# Patient Record
Sex: Female | Born: 1995 | Race: White | Hispanic: No | Marital: Married | Smoking: Never smoker
Health system: Southern US, Community
[De-identification: ages and names within clinical notes are randomized; demographics above are authoritative.]

## PROBLEM LIST (undated history)

## (undated) DIAGNOSIS — R51 Headache: Secondary | ICD-10-CM

## (undated) DIAGNOSIS — R519 Headache, unspecified: Secondary | ICD-10-CM

## (undated) HISTORY — PX: NO PAST SURGERIES: SHX2092

---
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
## 2014-05-03 ENCOUNTER — Emergency Department (HOSPITAL_COMMUNITY)
Admission: EM | Admit: 2014-05-03 | Discharge: 2014-05-03 | Disposition: A | Discharge status: Home / Self Care | Payer: No Typology Code available for payment source | Attending: Emergency Medicine | Admitting: Emergency Medicine

## 2014-05-03 ENCOUNTER — Emergency Department (HOSPITAL_COMMUNITY): Payer: No Typology Code available for payment source

## 2014-05-03 ENCOUNTER — Encounter (HOSPITAL_COMMUNITY): Payer: Self-pay | Admitting: Emergency Medicine

## 2014-05-03 DIAGNOSIS — Z3202 Encounter for pregnancy test, result negative: Secondary | ICD-10-CM | POA: Insufficient documentation

## 2014-05-03 DIAGNOSIS — Y92410 Unspecified street and highway as the place of occurrence of the external cause: Secondary | ICD-10-CM | POA: Insufficient documentation

## 2014-05-03 DIAGNOSIS — S0990XA Unspecified injury of head, initial encounter: Secondary | ICD-10-CM | POA: Insufficient documentation

## 2014-05-03 DIAGNOSIS — IMO0002 Reserved for concepts with insufficient information to code with codable children: Secondary | ICD-10-CM | POA: Insufficient documentation

## 2014-05-03 DIAGNOSIS — Y9389 Activity, other specified: Secondary | ICD-10-CM | POA: Insufficient documentation

## 2014-05-03 HISTORY — DX: Headache, unspecified: R51.9

## 2014-05-03 HISTORY — DX: Headache: R51

## 2014-05-03 LAB — PREGNANCY, URINE: Preg Test, Ur: NEGATIVE

## 2014-05-03 MED ORDER — TRAMADOL HCL 50 MG PO TABS
50.0000 mg | ORAL_TABLET | Freq: Once | ORAL | Status: AC
  Administered 2014-05-03: 50 mg via ORAL
  Filled 2014-05-03: qty 1

## 2014-05-03 MED ORDER — METHOCARBAMOL 500 MG PO TABS
500.0000 mg | ORAL_TABLET | Freq: Once | ORAL | Status: AC
  Administered 2014-05-03: 500 mg via ORAL
  Filled 2014-05-03: qty 1

## 2014-05-03 MED ORDER — HYDROCODONE-ACETAMINOPHEN 5-325 MG PO TABS: 1.0000 | ORAL_TABLET | Freq: Four times a day (QID) | ORAL | Status: DC | PRN

## 2014-05-03 NOTE — ED Notes (Signed)
PA at bedside.

## 2014-05-03 NOTE — ED Notes (Signed)
Pt verbalizes that she has ALL belongings she arrived with
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
## 2014-05-03 NOTE — ED Notes (Signed)
Pt was called to Fast track room 9. No answer will attempt again shortly

## 2014-05-03 NOTE — Discharge Instructions (Signed)
Please follow the directions provided.  You may take Ibuprofen 400 mg by mouth every 6-8 hours for pain.    SEEK IMMEDIATE MEDICAL CARE IF:  You have numbness, tingling, or weakness in the arms or legs.  You develop severe headaches not relieved with medicine.  You have severe neck pain, especially tenderness in the middle of the back of your neck.  You have changes in bowel or bladder control.  There is increasing pain in any area of the body.  You have shortness of breath, light-headedness, dizziness, or fainting.  You have chest pain.  You feel sick to your stomach (nauseous), throw up (vomit), or sweat.  You have increasing abdominal discomfort.  There is blood in your urine, stool, or vomit.  You have pain in your shoulder (shoulder strap areas).  You feel your symptoms are getting worse.

## 2014-05-03 NOTE — ED Notes (Signed)
Pt ambulated to XRAY with steady gait 

## 2014-05-03 NOTE — ED Notes (Signed)
She reports headache on the top of her head. Denies nausea, vomiting or difficulty thinking.

## 2014-05-06 NOTE — ED Provider Notes (Signed)
Medical screening examination/treatment/procedure(s) were performed by non-physician practitioner and as supervising physician I was immediately available for consultation/collaboration.   EKG Interpretation None       Ari Bernabei, MD 05/06/14 1501 

## 2014-08-13 NOTE — L&D Delivery Note (Signed)
Delivery Note Pt pushed well and at 4:06 AM a healthy female was delivered via Vaginal, Spontaneous Delivery (Presentation: Left Occiput Anterior).  APGAR: 9, 9; weight  pending.   Placenta status: Intact, Spontaneous.  Cord: 3 vessels with the following complications: None.   Anesthesia: Epidural  Episiotomy: None Lacerations:  Bilateral labial abrasions Suture Repair: 3.0 vicryl rapide Est. Blood Loss (mL):    Mom to postpartum.  Baby to Couplet care / Skin to Skin. D/w pt circumcision and they plan to proceed in office Keyonda Bickle W 04/08/2015, 4:35 AM

## 2014-09-21 LAB — OB RESULTS CONSOLE RPR: RPR: NONREACTIVE

## 2014-09-21 LAB — OB RESULTS CONSOLE GC/CHLAMYDIA
Chlamydia: NEGATIVE
Gonorrhea: NEGATIVE

## 2014-09-21 LAB — OB RESULTS CONSOLE RUBELLA ANTIBODY, IGM: Rubella: IMMUNE

## 2014-09-21 LAB — OB RESULTS CONSOLE HIV ANTIBODY (ROUTINE TESTING): HIV: NONREACTIVE

## 2014-09-21 LAB — OB RESULTS CONSOLE HEPATITIS B SURFACE ANTIGEN: Hepatitis B Surface Ag: NEGATIVE

## 2015-03-07 LAB — OB RESULTS CONSOLE GBS: STREP GROUP B AG: NEGATIVE

## 2015-04-07 ENCOUNTER — Encounter (HOSPITAL_COMMUNITY): Payer: Self-pay | Admitting: *Deleted

## 2015-04-07 ENCOUNTER — Inpatient Hospital Stay (HOSPITAL_COMMUNITY)
Admission: AD | Admit: 2015-04-07 | Discharge: 2015-04-07 | Disposition: A | Discharge status: Home / Self Care | Payer: Medicaid Other | Source: Ambulatory Visit | Attending: Obstetrics and Gynecology | Admitting: Obstetrics and Gynecology

## 2015-04-07 ENCOUNTER — Inpatient Hospital Stay (HOSPITAL_COMMUNITY): Payer: Medicaid Other | Admitting: Anesthesiology

## 2015-04-07 ENCOUNTER — Encounter (HOSPITAL_COMMUNITY): Payer: Self-pay

## 2015-04-07 ENCOUNTER — Inpatient Hospital Stay (HOSPITAL_COMMUNITY)
Admission: AD | Admit: 2015-04-07 | Discharge: 2015-04-10 | DRG: 775 | Disposition: A | Discharge status: Home / Self Care | Payer: Medicaid Other | Source: Ambulatory Visit | Attending: Obstetrics and Gynecology | Admitting: Obstetrics and Gynecology

## 2015-04-07 DIAGNOSIS — Z3A39 39 weeks gestation of pregnancy: Secondary | ICD-10-CM | POA: Diagnosis present

## 2015-04-07 DIAGNOSIS — IMO0001 Reserved for inherently not codable concepts without codable children: Secondary | ICD-10-CM

## 2015-04-07 LAB — CBC
HCT: 37.4 % (ref 36.0–46.0)
Hemoglobin: 12.3 g/dL (ref 12.0–15.0)
MCH: 28.2 pg (ref 26.0–34.0)
MCHC: 32.9 g/dL (ref 30.0–36.0)
MCV: 85.8 fL (ref 78.0–100.0)
PLATELETS: 274 10*3/uL (ref 150–400)
RBC: 4.36 MIL/uL (ref 3.87–5.11)
RDW: 13.9 % (ref 11.5–15.5)
WBC: 15.9 10*3/uL — AB (ref 4.0–10.5)

## 2015-04-07 LAB — TYPE AND SCREEN
ABO/RH(D): A POS
ANTIBODY SCREEN: NEGATIVE

## 2015-04-07 LAB — ABO/RH: ABO/RH(D): A POS

## 2015-04-07 MED ORDER — BUTORPHANOL TARTRATE 1 MG/ML IJ SOLN
2.0000 mg | INTRAMUSCULAR | Status: DC | PRN
  Administered 2015-04-07 (×2): 2 mg via INTRAVENOUS
  Filled 2015-04-07 (×2): qty 2

## 2015-04-07 MED ORDER — DIPHENHYDRAMINE HCL 50 MG/ML IJ SOLN: 12.5000 mg | INTRAMUSCULAR | Status: DC | PRN

## 2015-04-07 MED ORDER — OXYCODONE-ACETAMINOPHEN 5-325 MG PO TABS: 2.0000 | ORAL_TABLET | ORAL | Status: DC | PRN

## 2015-04-07 MED ORDER — LIDOCAINE HCL (PF) 1 % IJ SOLN
30.0000 mL | INTRAMUSCULAR | Status: DC | PRN
  Filled 2015-04-07 (×2): qty 30

## 2015-04-07 MED ORDER — ONDANSETRON HCL 4 MG/2ML IJ SOLN: 4.0000 mg | Freq: Four times a day (QID) | INTRAMUSCULAR | Status: DC | PRN

## 2015-04-07 MED ORDER — EPHEDRINE 5 MG/ML INJ
10.0000 mg | INTRAVENOUS | Status: DC | PRN
  Filled 2015-04-07: qty 2

## 2015-04-07 MED ORDER — PHENYLEPHRINE 40 MCG/ML (10ML) SYRINGE FOR IV PUSH (FOR BLOOD PRESSURE SUPPORT)
80.0000 ug | PREFILLED_SYRINGE | INTRAVENOUS | Status: DC | PRN
  Filled 2015-04-07: qty 2

## 2015-04-07 MED ORDER — OXYCODONE-ACETAMINOPHEN 5-325 MG PO TABS: 1.0000 | ORAL_TABLET | ORAL | Status: DC | PRN

## 2015-04-07 MED ORDER — FENTANYL 2.5 MCG/ML BUPIVACAINE 1/10 % EPIDURAL INFUSION (WH - ANES)
14.0000 mL/h | INTRAMUSCULAR | Status: DC | PRN
  Administered 2015-04-07: 14 mL/h via EPIDURAL

## 2015-04-07 MED ORDER — ACETAMINOPHEN 325 MG PO TABS: 650.0000 mg | ORAL_TABLET | ORAL | Status: DC | PRN

## 2015-04-07 MED ORDER — LACTATED RINGERS IV SOLN
INTRAVENOUS | Status: DC
  Administered 2015-04-07 – 2015-04-08 (×2): via INTRAVENOUS

## 2015-04-07 MED ORDER — CITRIC ACID-SODIUM CITRATE 334-500 MG/5ML PO SOLN: 30.0000 mL | ORAL | Status: DC | PRN

## 2015-04-07 MED ORDER — OXYTOCIN 40 UNITS IN LACTATED RINGERS INFUSION - SIMPLE MED
62.5000 mL/h | INTRAVENOUS | Status: DC
  Filled 2015-04-07: qty 1000

## 2015-04-07 MED ORDER — FENTANYL 2.5 MCG/ML BUPIVACAINE 1/10 % EPIDURAL INFUSION (WH - ANES): 14.0000 mL/h | INTRAMUSCULAR | Status: DC | PRN

## 2015-04-07 MED ORDER — PHENYLEPHRINE 40 MCG/ML (10ML) SYRINGE FOR IV PUSH (FOR BLOOD PRESSURE SUPPORT)
PREFILLED_SYRINGE | INTRAVENOUS | Status: AC
  Filled 2015-04-07: qty 20

## 2015-04-07 MED ORDER — OXYTOCIN 40 UNITS IN LACTATED RINGERS INFUSION - SIMPLE MED
1.0000 m[IU]/min | INTRAVENOUS | Status: DC
  Administered 2015-04-07: 2 m[IU]/min via INTRAVENOUS

## 2015-04-07 MED ORDER — LIDOCAINE HCL (PF) 1 % IJ SOLN
INTRAMUSCULAR | Status: DC | PRN
  Administered 2015-04-07: 6 mL via EPIDURAL
  Administered 2015-04-07: 4 mL

## 2015-04-07 MED ORDER — FENTANYL 2.5 MCG/ML BUPIVACAINE 1/10 % EPIDURAL INFUSION (WH - ANES)
INTRAMUSCULAR | Status: AC
  Administered 2015-04-07: 14 mL/h via EPIDURAL
  Filled 2015-04-07: qty 125

## 2015-04-07 MED ORDER — OXYTOCIN BOLUS FROM INFUSION
500.0000 mL | INTRAVENOUS | Status: DC
  Administered 2015-04-08: 500 mL via INTRAVENOUS

## 2015-04-07 MED ORDER — TERBUTALINE SULFATE 1 MG/ML IJ SOLN: 0.2500 mg | Freq: Once | INTRAMUSCULAR | Status: DC | PRN

## 2015-04-07 MED ORDER — FLEET ENEMA 7-19 GM/118ML RE ENEM: 1.0000 | ENEMA | RECTAL | Status: DC | PRN

## 2015-04-07 MED ORDER — LACTATED RINGERS IV SOLN: 500.0000 mL | INTRAVENOUS | Status: DC | PRN

## 2015-04-07 NOTE — MAU Note (Signed)
Patient given a clear liquid diet.

## 2015-04-07 NOTE — MAU Note (Signed)
Dr. Senaida Ores called to have update on patient status. Dr. Elvera Lennox on L&D and will see if patient can be moved to L&D now.

## 2015-04-07 NOTE — H&P (Addendum)
Megan Gray is a 19 y.o. female G1P0 at 39+weeks (EDD 04/11/15 by LMP c/w 11 week [place])  presenting for the second time in 12 hours with c/o painful contractions.  Her cervix has made slow progress but she has been too uncomfortable to stay home. 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    Maternal Medical History:  Reason for admission: Contractions.   Contractions: Onset was 6-12 hours ago.   Frequency: regular.   Perceived severity is moderate.    Fetal activity: Perceived fetal activity is normal.    Prenatal Complications - Diabetes: none.    OB History    Gravida Para Term Preterm AB TAB SAB Ectopic Multiple Living   1 0        0     Past Medical History  Diagnosis Date  . Headache    Past Surgical History  Procedure Laterality Date  . No past surgeries     Family History: family history is not on file. Social History:  reports that she has never smoked. She has never used smokeless tobacco. She reports that she does not drink alcohol or use illicit drugs.   Prenatal Transfer Tool  Maternal Diabetes: No Genetic Screening: Declined Maternal Ultrasounds/Referrals: Normal Fetal Ultrasounds or other Referrals:  None Maternal Substance Abuse:  No Significant Maternal Medications:  Meds include: Other:   valtrex Significant Maternal Lab Results:  None Other Comments:  None  ROS  Dilation: 2 Effacement (%): 80 Station: -2 Exam by:: Dellie Burns, RN BSN Blood pressure 132/87, pulse 85, temperature 98.4 F (36.9 C), resp. rate 16, height  (1.753 m), weight 83.915 kg (185 lb), last menstrual period 04/13/2014. Maternal Exam:  Uterine Assessment: Contraction strength is moderate.  Contraction frequency is regular.   Abdomen: Fetal presentation: vertex  Introitus: Normal vulva. Vulva is negative for lesion.  Normal vagina.    Physical Exam   Constitutional: She appears well-developed and well-nourished.  Cardiovascular: Normal rate.   Respiratory: Effort normal.  GI: Soft.  Genitourinary: Vagina normal. Vulva exhibits no lesion.  Neurological: She is alert.  Psychiatric: She has a normal mood and affect.    Prenatal labs: ABO, Rh:  A positive  Antibody:  negative Rubella:  immune RPR:   Neg HBsAg:   neg HIV:   NR GBS:   negative One hour GTT 99 Declined genetics  Assessment/Plan: Pt will be admitted in early labor for pain management and augment as needed.     Oliver Pila 04/07/2015, 12:40 PM   Pt seen in MAU as L&D very busy and cannot take pt.  Pt resting comfortably in bed, states contractions are worse sometimes than others.  Has not received any pain medication as of yet.  Has stadol ordered, but I advised pt she should hold off until more uncomfortable. Cervix reexamined and 80/2/-2 with no significant change.  We Discussed early labor and how this can be protracted.  I offered her d/c to home so she could eat and rest since may be awhile before L&D available and she is not changing her  cervix rapidly.  Pt states she feels she needs to stay as pain is already almost unbearable.  Advised could be several more hours.

## 2015-04-07 NOTE — Progress Notes (Signed)
Patient ID: Megan Gray, female   DOB: 03/02/1996, 19 y.o.   MRN: [id number] Pt just now got to a bed on L&D Has received stadol x 2 in MAU afeb vss FHR reassuring, no decels Cervix 90/3-4/-1 AROM clear  Will begin pitocin to augment

## 2015-04-07 NOTE — Progress Notes (Signed)
Dr. Jackelyn Knife notified of patient status. Per MD give patient option of being rechecked in 1 hour following ambulation and if unchanged discharge home or discharge home now.

## 2015-04-07 NOTE — MAU Provider Note (Signed)
Pt still awaiting bed in MAU Tearful and flushed stating in pain and needing epidural FHR reassuring intermittently afeb vss Cervix 2+/90/-2  Pt still in early labor and L&D full Explained to pt and family that pts who have more advanced dilation must  take precedence in moving to L&D and offered stadol Pt agreeable to stadol and this is given Will put baby back on continuous monitoring

## 2015-04-07 NOTE — Anesthesia Procedure Notes (Signed)
Epidural Patient location during procedure: OB  Preanesthetic Checklist Completed: patient identified, site marked, surgical consent, pre-op evaluation, timeout performed, IV checked, risks and benefits discussed and monitors and equipment checked  Epidural Patient position: sitting Prep: site prepped and draped and DuraPrep Patient monitoring: continuous pulse ox and blood pressure Approach: midline Location: L3-L4 Injection technique: LOR air  Needle:  Needle type: Tuohy  Needle gauge: 17 G Needle length: 9 cm and 9 Needle insertion depth: 4 cm Catheter type: closed end flexible Catheter size: 19 Gauge Catheter at skin depth: 10 cm Test dose: negative  Assessment Events: blood not aspirated, injection not painful, no injection resistance, negative IV test and no paresthesia  Additional Notes Dosing of Epidural:  1st dose, through catheter .............................................  Xylocaine 40 mg  2nd dose, through catheter, after waiting 3 minutes.........Xylocaine 60 mg    As each dose occurred, patient was free of IV sx; and patient exhibited no evidence of SA injection.  Patient is more comfortable after epidural dosed. Please see RN's note for documentation of vital signs,and FHR which are stable.  Patient reminded not to try to ambulate with numb legs, and that an RN must be present when she attempts to get up.       

## 2015-04-07 NOTE — Progress Notes (Signed)
Patient desires to ambulate and be rechecked in 1 hour.

## 2015-04-07 NOTE — MAU Note (Signed)
Pt presents complaining of contractions every 4 minutes. Denies bleeding or leaking of fluid. Reports good fetal movement.

## 2015-04-07 NOTE — MAU Note (Signed)
Started cramping around 5:00 pm yesterday came in MAU around 1:30 was sent home at 5:30 am today, 2 cm when left, now 10/10

## 2015-04-07 NOTE — Progress Notes (Signed)
FHT from this am reviewed.  Reactive NST, fairly regular ctx.

## 2015-04-07 NOTE — Discharge Instructions (Signed)
Fetal Movement Counts °Patient Name: __________________________________________________ Patient Due Date: ____________________ °Performing a fetal movement count is highly recommended in high-risk pregnancies, but it is good for every pregnant woman to do. Your health care provider may ask you to start counting fetal movements at 28 weeks of the pregnancy. Fetal movements often increase: °· After eating a full meal. °· After physical activity. °· After eating or drinking something sweet or cold. °· At rest. °Pay attention to when you feel the baby is most active. This will help you notice a pattern of your baby's sleep and wake cycles and what factors contribute to an increase in fetal movement. It is important to perform a fetal movement count at the same time each day when your baby is normally most active.  °HOW TO COUNT FETAL MOVEMENTS °1. Find a quiet and comfortable area to sit or lie down on your left side. Lying on your left side provides the best blood and oxygen circulation to your baby. °2. Write down the day and time on a sheet of paper or in a journal. °3. Start counting kicks, flutters, swishes, rolls, or jabs in a 2-hour period. You should feel at least 10 movements within 2 hours. °4. If you do not feel 10 movements in 2 hours, wait 2-3 hours and count again. Look for a change in the pattern or not enough counts in 2 hours. °SEEK MEDICAL CARE IF: °· You feel less than 10 counts in 2 hours, tried twice. °· There is no movement in over an hour. °· The pattern is changing or taking longer each day to reach 10 counts in 2 hours. °· You feel the baby is not moving as he or she usually does. °Date: ____________ Movements: ____________ Start time: ____________ Finish time: ____________  °Date: ____________ Movements: ____________ Start time: ____________ Finish time: ____________ °Date: ____________ Movements: ____________ Start time: ____________ Finish time: ____________ °Date: ____________ Movements:  ____________ Start time: ____________ Finish time: ____________ °Date: ____________ Movements: ____________ Start time: ____________ Finish time: ____________ °Date: ____________ Movements: ____________ Start time: ____________ Finish time: ____________ °Date: ____________ Movements: ____________ Start time: ____________ Finish time: ____________ °Date: ____________ Movements: ____________ Start time: ____________ Finish time: ____________  °Date: ____________ Movements: ____________ Start time: ____________ Finish time: ____________ °Date: ____________ Movements: ____________ Start time: ____________ Finish time: ____________ °Date: ____________ Movements: ____________ Start time: ____________ Finish time: ____________ °Date: ____________ Movements: ____________ Start time: ____________ Finish time: ____________ °Date: ____________ Movements: ____________ Start time: ____________ Finish time: ____________ °Date: ____________ Movements: ____________ Start time: ____________ Finish time: ____________ °Date: ____________ Movements: ____________ Start time: ____________ Finish time: ____________  °Date: ____________ Movements: ____________ Start time: ____________ Finish time: ____________ °Date: ____________ Movements: ____________ Start time: ____________ Finish time: ____________ °Date: ____________ Movements: ____________ Start time: ____________ Finish time: ____________ °Date: ____________ Movements: ____________ Start time: ____________ Finish time: ____________ °Date: ____________ Movements: ____________ Start time: ____________ Finish time: ____________ °Date: ____________ Movements: ____________ Start time: ____________ Finish time: ____________ °Date: ____________ Movements: ____________ Start time: ____________ Finish time: ____________  °Date: ____________ Movements: ____________ Start time: ____________ Finish time: ____________ °Date: ____________ Movements: ____________ Start time: ____________ Finish  time: ____________ °Date: ____________ Movements: ____________ Start time: ____________ Finish time: ____________ °Date: ____________ Movements: ____________ Start time: ____________ Finish time: ____________ °Date: ____________ Movements: ____________ Start time: ____________ Finish time: ____________ °Date: ____________ Movements: ____________ Start time: ____________ Finish time: ____________ °Date: ____________ Movements: ____________ Start time: ____________ Finish time: ____________  °Date: ____________ Movements: ____________ Start time: ____________ Finish   time: ____________ °Date: ____________ Movements: ____________ Start time: ____________ Finish time: ____________ °Date: ____________ Movements: ____________ Start time: ____________ Finish time: ____________ °Date: ____________ Movements: ____________ Start time: ____________ Finish time: ____________ °Date: ____________ Movements: ____________ Start time: ____________ Finish time: ____________ °Date: ____________ Movements: ____________ Start time: ____________ Finish time: ____________ °Date: ____________ Movements: ____________ Start time: ____________ Finish time: ____________  °Date: ____________ Movements: ____________ Start time: ____________ Finish time: ____________ °Date: ____________ Movements: ____________ Start time: ____________ Finish time: ____________ °Date: ____________ Movements: ____________ Start time: ____________ Finish time: ____________ °Date: ____________ Movements: ____________ Start time: ____________ Finish time: ____________ °Date: ____________ Movements: ____________ Start time: ____________ Finish time: ____________ °Date: ____________ Movements: ____________ Start time: ____________ Finish time: ____________ °Date: ____________ Movements: ____________ Start time: ____________ Finish time: ____________  °Date: ____________ Movements: ____________ Start time: ____________ Finish time: ____________ °Date: ____________  Movements: ____________ Start time: ____________ Finish time: ____________ °Date: ____________ Movements: ____________ Start time: ____________ Finish time: ____________ °Date: ____________ Movements: ____________ Start time: ____________ Finish time: ____________ °Date: ____________ Movements: ____________ Start time: ____________ Finish time: ____________ °Date: ____________ Movements: ____________ Start time: ____________ Finish time: ____________ °Date: ____________ Movements: ____________ Start time: ____________ Finish time: ____________  °Date: ____________ Movements: ____________ Start time: ____________ Finish time: ____________ °Date: ____________ Movements: ____________ Start time: ____________ Finish time: ____________ °Date: ____________ Movements: ____________ Start time: ____________ Finish time: ____________ °Date: ____________ Movements: ____________ Start time: ____________ Finish time: ____________ °Date: ____________ Movements: ____________ Start time: ____________ Finish time: ____________ °Date: ____________ Movements: ____________ Start time: ____________ Finish time: ____________ °Document Released: 08/29/2006 Document Revised: 12/14/2013 Document Reviewed: 05/26/2012 °[employer]® Patient Information ©2015 [employer]. This information is not intended to replace advice given to you by your health care provider. Make sure you discuss any questions you have with your health care provider. °Braxton Hicks Contractions °Contractions of the uterus can occur throughout pregnancy. Contractions are not always a sign that you are in labor.  °WHAT ARE BRAXTON HICKS CONTRACTIONS?  °Contractions that occur before labor are called Braxton Hicks contractions, or false labor. Toward the end of pregnancy (32-34 weeks), these contractions can develop more often and may become more forceful. This is not true labor because these contractions do not result in opening (dilatation) and thinning of the cervix. They  are sometimes difficult to tell apart from true labor because these contractions can be forceful and people have different pain tolerances. You should not feel embarrassed if you go to the hospital with false labor. Sometimes, the only way to tell if you are in true labor is for your health care provider to look for changes in the cervix. °If there are no prenatal problems or other health problems associated with the pregnancy, it is completely safe to be sent home with false labor and await the onset of true labor. °HOW CAN YOU TELL THE DIFFERENCE BETWEEN TRUE AND FALSE LABOR? °False Labor °· The contractions of false labor are usually shorter and not as hard as those of true labor.   °· The contractions are usually irregular.   °· The contractions are often felt in the front of the lower abdomen and in the groin.   °· The contractions may go away when you walk around or change positions while lying down.   °· The contractions get weaker and are shorter lasting as time goes on.   °· The contractions do not usually become progressively stronger, regular, and closer together as with true labor.   °True Labor °5. Contractions in true labor last 30-70 seconds, become   very regular, usually become more intense, and increase in frequency.   °6. The contractions do not go away with walking.   °7. The discomfort is usually felt in the top of the uterus and spreads to the lower abdomen and low back.   °8. True labor can be determined by your health care provider with an exam. This will show that the cervix is dilating and getting thinner.   °WHAT TO REMEMBER °· Keep up with your usual exercises and follow other instructions given by your health care provider.   °· Take medicines as directed by your health care provider.   °· Keep your regular prenatal appointments.   °· Eat and drink lightly if you think you are going into labor.   °· If Braxton Hicks contractions are making you uncomfortable:   °· Change your position from  lying down or resting to walking, or from walking to resting.   °· Sit and rest in a tub of warm water.   °· Drink 2-3 glasses of water. Dehydration may cause these contractions.   °· Do slow and deep breathing several times an hour.   °WHEN SHOULD I SEEK IMMEDIATE MEDICAL CARE? °Seek immediate medical care if: °· Your contractions become stronger, more regular, and closer together.   °· You have fluid leaking or gushing from your vagina.   °· You have a fever.   °· You pass blood-tinged mucus.   °· You have vaginal bleeding.   °· You have continuous abdominal pain.   °· You have low back pain that you never had before.   °· You feel your baby's head pushing down and causing pelvic pressure.   °· Your baby is not moving as much as it used to.   °Document Released: 07/30/2005 Document Revised: 08/04/2013 Document Reviewed: 05/11/2013 °[employer]® Patient Information ©2015 [employer]. This information is not intended to replace advice given to you by your health care provider. Make sure you discuss any questions you have with your health care provider. ° °

## 2015-04-07 NOTE — Anesthesia Preprocedure Evaluation (Signed)

## 2015-04-08 ENCOUNTER — Encounter (HOSPITAL_COMMUNITY): Payer: Self-pay | Admitting: General Practice

## 2015-04-08 LAB — CBC
HCT: 33.6 % — ABNORMAL LOW (ref 36.0–46.0)
Hemoglobin: 11 g/dL — ABNORMAL LOW (ref 12.0–15.0)
MCH: 28.1 pg (ref 26.0–34.0)
MCHC: 32.7 g/dL (ref 30.0–36.0)
MCV: 85.9 fL (ref 78.0–100.0)
PLATELETS: 248 10*3/uL (ref 150–400)
RBC: 3.91 MIL/uL (ref 3.87–5.11)
RDW: 14.1 % (ref 11.5–15.5)
WBC: 21.6 10*3/uL — AB (ref 4.0–10.5)

## 2015-04-08 LAB — RPR: RPR Ser Ql: NONREACTIVE

## 2015-04-08 MED ORDER — LANOLIN HYDROUS EX OINT: TOPICAL_OINTMENT | CUTANEOUS | Status: DC | PRN

## 2015-04-08 MED ORDER — SENNOSIDES-DOCUSATE SODIUM 8.6-50 MG PO TABS
2.0000 | ORAL_TABLET | ORAL | Status: DC
  Administered 2015-04-08 – 2015-04-09 (×2): 2 via ORAL
  Filled 2015-04-08 (×2): qty 2

## 2015-04-08 MED ORDER — ZOLPIDEM TARTRATE 5 MG PO TABS: 5.0000 mg | ORAL_TABLET | Freq: Every evening | ORAL | Status: DC | PRN

## 2015-04-08 MED ORDER — IBUPROFEN 600 MG PO TABS
600.0000 mg | ORAL_TABLET | Freq: Four times a day (QID) | ORAL | Status: DC
Start: 2015-04-08 — End: 2015-04-10
  Administered 2015-04-08 – 2015-04-10 (×10): 600 mg via ORAL
  Filled 2015-04-08 (×10): qty 1

## 2015-04-08 MED ORDER — WITCH HAZEL-GLYCERIN EX PADS: 1.0000 "application " | MEDICATED_PAD | CUTANEOUS | Status: DC | PRN

## 2015-04-08 MED ORDER — PRENATAL MULTIVITAMIN CH
1.0000 | ORAL_TABLET | Freq: Every day | ORAL | Status: DC
  Administered 2015-04-08 – 2015-04-10 (×3): 1 via ORAL
  Filled 2015-04-08 (×3): qty 1

## 2015-04-08 MED ORDER — OXYCODONE-ACETAMINOPHEN 5-325 MG PO TABS: 2.0000 | ORAL_TABLET | ORAL | Status: DC | PRN

## 2015-04-08 MED ORDER — VALACYCLOVIR HCL 500 MG PO TABS
1000.0000 mg | ORAL_TABLET | Freq: Every day | ORAL | Status: DC
  Administered 2015-04-08 – 2015-04-10 (×3): 1000 mg via ORAL
  Filled 2015-04-08 (×4): qty 2

## 2015-04-08 MED ORDER — DIBUCAINE 1 % RE OINT: 1.0000 "application " | TOPICAL_OINTMENT | RECTAL | Status: DC | PRN

## 2015-04-08 MED ORDER — SIMETHICONE 80 MG PO CHEW
80.0000 mg | CHEWABLE_TABLET | ORAL | Status: DC | PRN
Start: 2015-04-08 — End: 2015-04-10

## 2015-04-08 MED ORDER — TETANUS-DIPHTH-ACELL PERTUSSIS 5-2.5-18.5 LF-MCG/0.5 IM SUSP
0.5000 mL | Freq: Once | INTRAMUSCULAR | Status: DC
Start: 2015-04-09 — End: 2015-04-10

## 2015-04-08 MED ORDER — ONDANSETRON HCL 4 MG PO TABS: 4.0000 mg | ORAL_TABLET | ORAL | Status: DC | PRN

## 2015-04-08 MED ORDER — DIPHENHYDRAMINE HCL 25 MG PO CAPS: 25.0000 mg | ORAL_CAPSULE | Freq: Four times a day (QID) | ORAL | Status: DC | PRN

## 2015-04-08 MED ORDER — ACETAMINOPHEN 325 MG PO TABS: 650.0000 mg | ORAL_TABLET | ORAL | Status: DC | PRN

## 2015-04-08 MED ORDER — ONDANSETRON HCL 4 MG/2ML IJ SOLN: 4.0000 mg | INTRAMUSCULAR | Status: DC | PRN

## 2015-04-08 MED ORDER — BENZOCAINE-MENTHOL 20-0.5 % EX AERO
1.0000 "application " | INHALATION_SPRAY | CUTANEOUS | Status: DC | PRN
  Administered 2015-04-08: 1 via TOPICAL
  Filled 2015-04-08: qty 56

## 2015-04-08 MED ORDER — OXYCODONE-ACETAMINOPHEN 5-325 MG PO TABS: 1.0000 | ORAL_TABLET | ORAL | Status: DC | PRN

## 2015-04-08 NOTE — Progress Notes (Signed)
Patient ID: Megan Gray, female   DOB: 28-Jul-1996, 19 y.o.   MRN: [id number] DOD  Doing fine no c/o

## 2015-04-08 NOTE — Progress Notes (Signed)
Patient ID: Megan Gray, female   DOB: 1995-10-16, 19 y.o.   MRN: [id number] Pt comfortable but feeling some pressure afeb vss FHR category 1 Cervix c/c/rim  +2 Setting up delivery table and then will begin pushing shortly when complete

## 2015-04-08 NOTE — Lactation Note (Signed)
This note was copied from the chart of Megan Countess Biebel. Lactation Consultation Note  Patient Name: Megan Gray [id number] Date: 04/08/2015 Reason for consult: Initial assessment   Maternal Data Formula Feeding for Exclusion: No Has patient been taught Hand Expression?: Yes Does the patient have breastfeeding experience prior to this delivery?: No  Feeding Feeding Type: Bottle Fed - Formula Nipple Type: Slow - flow Length of feed: 10 min  LATCH Score/Interventions Latch: Repeated attempts needed to sustain latch, nipple held in mouth throughout feeding, stimulation needed to elicit sucking reflex. Intervention(s): Adjust position;Assist with latch  Audible Swallowing: None  Type of Nipple: Everted at rest and after stimulation  Comfort (Breast/Nipple): Soft / non-tender     Hold (Positioning): Assistance needed to correctly position infant at breast and maintain latch. Intervention(s): Breastfeeding basics reviewed;Support Pillows;Position options;Skin to skin  LATCH Score: 6  Lactation Tools Discussed/Used Pump Review: Setup, frequency, and cleaning;Milk Storage;Other (comment) (premie setting, hand expression) Initiated by:: clee rn Date initiated:: 04/08/15   Consult Status Consult Status: Follow-up Date: 04/09/15 Follow-up type: In-patient    Megan Gray 04/08/2015, 6:09 PM

## 2015-04-08 NOTE — Lactation Note (Signed)
This note was copied from the chart of Megan Leigh Blas. Lactation Consultation Note  Patient Name: Megan Gray [id number] Date: 04/08/2015 Reason for consult: Initial assessment   With this mom of a term baby, now 63 hours old. Mom has had trouble latching the baby since birth. On exam, he has an anterior tongue frenulum that is sort, causing his tongue to stay behind his bottom gum, and has limited tongue mobility. Mom does not want to use a nipple shiled, but agreed to pumping. When she pumped and did not express any colostrum, I advised her to hand express. Mom is extremely uncomfortable with anyone seeing her breast, including her grandmother. Mom kept her hand covering her breast as I was trying to latch the baby. Mom decided to formula feed, but may pump and provide EBM. I explained that her milk should come in in a day or two, and pumping and bottle feeding EBm would be fine. Mom is going to think about it.  Dad formula fed the baby 10 mls of formula, in about 10 minutes. [name] had trouble coordinating his suck for a few minutes, and then got into a normal suck and swallow pattern. Parents aware to speak to their pediatrician about baby's oral anatomy, and that lactation is available in o/p if they desire.    Maternal Data Formula Feeding for Exclusion: No Has patient been taught Hand Expression?: Yes Does the patient have breastfeeding experience prior to this delivery?: No  Feeding Feeding Type: Breast Fed Length of feed: 30 min  LATCH Score/Interventions Latch: Repeated attempts needed to sustain latch, nipple held in mouth throughout feeding, stimulation needed to elicit sucking reflex. Intervention(s): Adjust position;Assist with latch  Audible Swallowing: None  Type of Nipple: Everted at rest and after stimulation  Comfort (Breast/Nipple): Soft / non-tender     Hold (Positioning): Assistance needed to correctly position infant at breast and maintain  latch. Intervention(s): Breastfeeding basics reviewed;Support Pillows;Position options;Skin to skin  LATCH Score: 6  Lactation Tools Discussed/Used Pump Review: Setup, frequency, and cleaning;Milk Storage;Other (comment) (premie setting, hand expression) Initiated by:: clee rn Date initiated:: 04/08/15   Consult Status Consult Status: Follow-up Date: 04/09/15 Follow-up type: In-patient    Alfred Levins 04/08/2015, 5:58 PM

## 2015-04-08 NOTE — Anesthesia Postprocedure Evaluation (Signed)
  Anesthesia Post-op Note  Patient: Megan Gray  Procedure(s) Performed: * No procedures listed *  Patient Location: [facility]  Anesthesia Type:Epidural  Level of Consciousness: awake  Airway and Oxygen Therapy: Patient Spontanous Breathing  Post-op Pain: mild  Post-op Assessment: Patient's Cardiovascular Status Stable and Respiratory Function Stable              Post-op Vital Signs: stable  Last Vitals:  Filed Vitals:   04/08/15 0728  BP: 91/48  Pulse: 93  Temp: 37.4 C  Resp:     Complications: No apparent anesthesia complications

## 2015-04-09 NOTE — Progress Notes (Signed)
Post Partum Day 1 Subjective: no complaints, up ad lib, voiding, tolerating PO and nl lochia, pain controlled  Objective: Blood pressure 97/60, pulse 100, temperature 98 F (36.7 C), temperature source Oral, resp. rate 16, height  (1.753 m), weight 83.915 kg (185 lb), last menstrual period 04/13/2014, SpO2 100 %, unknown if currently breastfeeding.  Physical Exam:  General: alert and no distress Lochia: appropriate Uterine Fundus: firm   Recent Labs  04/07/15 1640 04/08/15 0855  HGB 12.3 11.0*  HCT 37.4 33.6*    Assessment/Plan: Plan for discharge tomorrow, Breastfeeding and Lactation consult.  Routine PP care.     LOS: 2 days   Bovard-Stuckert, Jeziah Kretschmer 04/09/2015, 7:57 AM

## 2015-04-10 MED ORDER — IBUPROFEN 800 MG PO TABS: 800.0000 mg | ORAL_TABLET | Freq: Three times a day (TID) | ORAL | Status: DC | PRN

## 2015-04-10 MED ORDER — PRENATAL MULTIVITAMIN CH: 1.0000 | ORAL_TABLET | Freq: Every day | ORAL | Status: AC

## 2015-04-10 MED ORDER — OXYCODONE-ACETAMINOPHEN 5-325 MG PO TABS: 1.0000 | ORAL_TABLET | Freq: Four times a day (QID) | ORAL | Status: DC | PRN

## 2015-04-10 NOTE — Discharge Summary (Signed)
Obstetric Discharge Summary Reason for Admission: onset of labor Prenatal Procedures: none Intrapartum Procedures: spontaneous vaginal delivery Postpartum Procedures: none Complications-Operative and Postpartum: B labial abrasion/ laceration HEMOGLOBIN  Date Value Ref Range Status  04/08/2015 11.0* 12.0 - 15.0 g/dL Final   HCT  Date Value Ref Range Status  04/08/2015 33.6* 36.0 - 46.0 % Final    Physical Exam:  General: alert and no distress Lochia: appropriate Uterine Fundus: firm  Discharge Diagnoses: Term Pregnancy-delivered  Discharge Information: Date: 04/10/2015 Activity: pelvic rest Diet: routine Medications: PNV, Ibuprofen and Percocet Condition: stable Instructions: refer to practice specific booklet Discharge to: home Follow-up Information    Follow up with Oliver Pila, MD. Schedule an appointment as soon as possible for a visit in 6 weeks.   Specialty:  Obstetrics and Gynecology   Why:  for postpartum check   Contact information:   [address] [place] [address] [phone]       Newborn Data: Live born female  Birth Weight: 8 lb 2.6 oz (3702 g) APGAR: 9, 9  Home with mother.  Bovard-Stuckert, Anissia Wessells 04/10/2015, 9:39 AM

## 2015-04-10 NOTE — Progress Notes (Signed)
Post Partum Day 2 Subjective: no complaints, up ad lib, voiding, tolerating PO and nl lochia, pain controlled  Objective: Blood pressure 108/71, pulse 82, temperature 98.1 F (36.7 C), temperature source Oral, resp. rate 20, height  (1.753 m), weight 83.915 kg (185 lb), last menstrual period 04/13/2014, SpO2 97 %, unknown if currently breastfeeding.  Physical Exam:  General: alert and no distress Lochia: appropriate Uterine Fundus: firm  Recent Labs  04/07/15 1640 04/08/15 0855  HGB 12.3 11.0*  HCT 37.4 33.6*    Assessment/Plan: Discharge home, Breastfeeding and Lactation consult.  Routine PP care.  D/C with Motrin, percocet and PNV.  F/u 6 weeks   LOS: 3 days   Bovard-Stuckert, Megan Gray 04/10/2015, 8:40 AM

## 2017-01-23 DIAGNOSIS — J029 Acute pharyngitis, unspecified: Secondary | ICD-10-CM | POA: Diagnosis not present

## 2017-01-23 DIAGNOSIS — J301 Allergic rhinitis due to pollen: Secondary | ICD-10-CM | POA: Diagnosis not present

## 2017-01-23 DIAGNOSIS — Z0000 Encounter for general adult medical examination without abnormal findings: Secondary | ICD-10-CM | POA: Diagnosis not present

## 2017-02-07 DIAGNOSIS — R208 Other disturbances of skin sensation: Secondary | ICD-10-CM | POA: Diagnosis not present

## 2017-03-01 DIAGNOSIS — Z0000 Encounter for general adult medical examination without abnormal findings: Secondary | ICD-10-CM | POA: Diagnosis not present

## 2017-03-17 DIAGNOSIS — S134XXA Sprain of ligaments of cervical spine, initial encounter: Secondary | ICD-10-CM | POA: Diagnosis not present

## 2017-03-17 DIAGNOSIS — M542 Cervicalgia: Secondary | ICD-10-CM | POA: Diagnosis not present

## 2017-03-17 DIAGNOSIS — S39012A Strain of muscle, fascia and tendon of lower back, initial encounter: Secondary | ICD-10-CM | POA: Diagnosis not present

## 2017-04-12 DIAGNOSIS — M542 Cervicalgia: Secondary | ICD-10-CM | POA: Diagnosis not present

## 2017-04-12 DIAGNOSIS — M545 Low back pain: Secondary | ICD-10-CM | POA: Diagnosis not present

## 2017-05-10 DIAGNOSIS — M542 Cervicalgia: Secondary | ICD-10-CM | POA: Diagnosis not present

## 2017-05-10 DIAGNOSIS — M545 Low back pain: Secondary | ICD-10-CM | POA: Diagnosis not present

## 2017-10-03 DIAGNOSIS — Z3041 Encounter for surveillance of contraceptive pills: Secondary | ICD-10-CM | POA: Diagnosis not present

## 2017-10-03 DIAGNOSIS — N898 Other specified noninflammatory disorders of vagina: Secondary | ICD-10-CM | POA: Diagnosis not present

## 2017-10-03 DIAGNOSIS — Z01419 Encounter for gynecological examination (general) (routine) without abnormal findings: Secondary | ICD-10-CM | POA: Diagnosis not present

## 2017-10-07 DIAGNOSIS — N898 Other specified noninflammatory disorders of vagina: Secondary | ICD-10-CM | POA: Diagnosis not present

## 2017-10-07 DIAGNOSIS — N941 Unspecified dyspareunia: Secondary | ICD-10-CM | POA: Diagnosis not present

## 2017-11-07 DIAGNOSIS — J029 Acute pharyngitis, unspecified: Secondary | ICD-10-CM | POA: Diagnosis not present

## 2017-11-07 DIAGNOSIS — R070 Pain in throat: Secondary | ICD-10-CM | POA: Diagnosis not present

## 2018-08-14 DIAGNOSIS — H40013 Open angle with borderline findings, low risk, bilateral: Secondary | ICD-10-CM | POA: Diagnosis not present

## 2018-09-04 DIAGNOSIS — N898 Other specified noninflammatory disorders of vagina: Secondary | ICD-10-CM | POA: Diagnosis not present

## 2018-09-04 DIAGNOSIS — N760 Acute vaginitis: Secondary | ICD-10-CM | POA: Diagnosis not present

## 2018-10-20 DIAGNOSIS — N760 Acute vaginitis: Secondary | ICD-10-CM | POA: Diagnosis not present

## 2018-11-06 DIAGNOSIS — N63 Unspecified lump in unspecified breast: Secondary | ICD-10-CM | POA: Diagnosis not present

## 2018-11-06 DIAGNOSIS — Z3041 Encounter for surveillance of contraceptive pills: Secondary | ICD-10-CM | POA: Diagnosis not present

## 2020-08-13 NOTE — L&D Delivery Note (Signed)
Delivery Note  Pt pushed well about 10 minutes and at 3:54 AM a healthy female was delivered via Vaginal, Spontaneous (Presentation: Right Occiput Anterior).  APGAR: 8, 9; weight  .   Placenta status: Spontaneous, Intact.  Cord: 3 vessels with the following complications: Nuchal x 1 reduced.  Anesthesia: Epidural Episiotomy: None Lacerations: Periurethral Suture Repair: 3.0 monocryl  for hemostasis Est. Blood Loss (mL):  Mom to postpartum.  Baby to Couplet care / Skin to Skin.  D/w pt circumcision and they desire to proceed.  Megan Gray 04/23/2021, 4:14 AM

## 2020-09-19 LAB — OB RESULTS CONSOLE GC/CHLAMYDIA
Chlamydia: NEGATIVE
Gonorrhea: NEGATIVE

## 2020-09-19 LAB — OB RESULTS CONSOLE HEPATITIS B SURFACE ANTIGEN: Hepatitis B Surface Ag: NEGATIVE

## 2020-09-19 LAB — OB RESULTS CONSOLE HIV ANTIBODY (ROUTINE TESTING): HIV: NONREACTIVE

## 2020-09-19 LAB — OB RESULTS CONSOLE ABO/RH: RH Type: POSITIVE

## 2020-09-19 LAB — OB RESULTS CONSOLE RUBELLA ANTIBODY, IGM: Rubella: IMMUNE

## 2020-09-19 LAB — OB RESULTS CONSOLE ANTIBODY SCREEN: Antibody Screen: NEGATIVE

## 2020-09-19 LAB — OB RESULTS CONSOLE RPR: RPR: NONREACTIVE

## 2021-04-18 ENCOUNTER — Telehealth (HOSPITAL_COMMUNITY): Payer: Self-pay | Admitting: *Deleted

## 2021-04-18 ENCOUNTER — Encounter (HOSPITAL_COMMUNITY): Payer: Self-pay | Admitting: *Deleted

## 2021-04-18 NOTE — Telephone Encounter (Signed)
Preadmission screen  

## 2021-04-22 ENCOUNTER — Other Ambulatory Visit: Payer: Self-pay

## 2021-04-22 ENCOUNTER — Inpatient Hospital Stay (HOSPITAL_COMMUNITY)
Admission: AD | Admit: 2021-04-22 | Discharge: 2021-04-24 | DRG: 806 | Disposition: A | Discharge status: Home / Self Care | Payer: 59 | Attending: Obstetrics and Gynecology | Admitting: Obstetrics and Gynecology

## 2021-04-22 ENCOUNTER — Encounter (HOSPITAL_COMMUNITY): Payer: Self-pay | Admitting: Obstetrics and Gynecology

## 2021-04-22 ENCOUNTER — Inpatient Hospital Stay (HOSPITAL_COMMUNITY): Payer: 59

## 2021-04-22 DIAGNOSIS — Z3A39 39 weeks gestation of pregnancy: Secondary | ICD-10-CM | POA: Diagnosis not present

## 2021-04-22 DIAGNOSIS — O9832 Other infections with a predominantly sexual mode of transmission complicating childbirth: Secondary | ICD-10-CM | POA: Diagnosis present

## 2021-04-22 DIAGNOSIS — A6000 Herpesviral infection of urogenital system, unspecified: Secondary | ICD-10-CM | POA: Diagnosis present

## 2021-04-22 DIAGNOSIS — O99824 Streptococcus B carrier state complicating childbirth: Secondary | ICD-10-CM | POA: Diagnosis present

## 2021-04-22 DIAGNOSIS — Z3490 Encounter for supervision of normal pregnancy, unspecified, unspecified trimester: Secondary | ICD-10-CM | POA: Diagnosis present

## 2021-04-22 DIAGNOSIS — O26893 Other specified pregnancy related conditions, third trimester: Secondary | ICD-10-CM | POA: Diagnosis present

## 2021-04-22 LAB — CBC
HCT: 35.9 % — ABNORMAL LOW (ref 36.0–46.0)
Hemoglobin: 11.3 g/dL — ABNORMAL LOW (ref 12.0–15.0)
MCH: 25.9 pg — ABNORMAL LOW (ref 26.0–34.0)
MCHC: 31.5 g/dL (ref 30.0–36.0)
MCV: 82.2 fL (ref 80.0–100.0)
Platelets: 258 10*3/uL (ref 150–400)
RBC: 4.37 MIL/uL (ref 3.87–5.11)
RDW: 14.2 % (ref 11.5–15.5)
WBC: 8.4 10*3/uL (ref 4.0–10.5)
nRBC: 0 % (ref 0.0–0.2)

## 2021-04-22 LAB — TYPE AND SCREEN
ABO/RH(D): A POS
Antibody Screen: NEGATIVE

## 2021-04-22 MED ORDER — SOD CITRATE-CITRIC ACID 500-334 MG/5ML PO SOLN: 30.0000 mL | ORAL | Status: DC | PRN

## 2021-04-22 MED ORDER — SODIUM CHLORIDE 0.9 % IV SOLN
5.0000 10*6.[IU] | Freq: Once | INTRAVENOUS | Status: AC
  Administered 2021-04-22: 10 10*6.[IU] via INTRAVENOUS
  Filled 2021-04-22: qty 5

## 2021-04-22 MED ORDER — ONDANSETRON HCL 4 MG/2ML IJ SOLN: 4.0000 mg | Freq: Four times a day (QID) | INTRAMUSCULAR | Status: DC | PRN

## 2021-04-22 MED ORDER — EPHEDRINE 5 MG/ML INJ: 10.0000 mg | INTRAVENOUS | Status: DC | PRN

## 2021-04-22 MED ORDER — LACTATED RINGERS IV SOLN: 500.0000 mL | INTRAVENOUS | Status: DC | PRN

## 2021-04-22 MED ORDER — TERBUTALINE SULFATE 1 MG/ML IJ SOLN: 0.2500 mg | Freq: Once | INTRAMUSCULAR | Status: DC | PRN

## 2021-04-22 MED ORDER — PHENYLEPHRINE 40 MCG/ML (10ML) SYRINGE FOR IV PUSH (FOR BLOOD PRESSURE SUPPORT): 80.0000 ug | PREFILLED_SYRINGE | INTRAVENOUS | Status: DC | PRN

## 2021-04-22 MED ORDER — BUTORPHANOL TARTRATE 1 MG/ML IJ SOLN
1.0000 mg | INTRAMUSCULAR | Status: DC | PRN
Start: 2021-04-22 — End: 2021-04-23
  Administered 2021-04-22: 1 mg via INTRAVENOUS
  Filled 2021-04-22: qty 1

## 2021-04-22 MED ORDER — ACETAMINOPHEN 325 MG PO TABS: 650.0000 mg | ORAL_TABLET | ORAL | Status: DC | PRN

## 2021-04-22 MED ORDER — LACTATED RINGERS IV SOLN: 500.0000 mL | Freq: Once | INTRAVENOUS | Status: DC

## 2021-04-22 MED ORDER — OXYTOCIN-SODIUM CHLORIDE 30-0.9 UT/500ML-% IV SOLN
1.0000 m[IU]/min | INTRAVENOUS | Status: DC
Start: 2021-04-22 — End: 2021-04-23
  Administered 2021-04-22: 2 m[IU]/min via INTRAVENOUS
  Filled 2021-04-22 (×2): qty 500

## 2021-04-22 MED ORDER — FENTANYL-BUPIVACAINE-NACL 0.5-0.125-0.9 MG/250ML-% EP SOLN
12.0000 mL/h | EPIDURAL | Status: DC | PRN
  Administered 2021-04-23: 12 mL/h via EPIDURAL
  Filled 2021-04-22: qty 250

## 2021-04-22 MED ORDER — LIDOCAINE HCL (PF) 1 % IJ SOLN: 30.0000 mL | INTRAMUSCULAR | Status: DC | PRN

## 2021-04-22 MED ORDER — LACTATED RINGERS IV SOLN: INTRAVENOUS | Status: DC

## 2021-04-22 MED ORDER — OXYCODONE-ACETAMINOPHEN 5-325 MG PO TABS: 1.0000 | ORAL_TABLET | ORAL | Status: DC | PRN

## 2021-04-22 MED ORDER — OXYTOCIN-SODIUM CHLORIDE 30-0.9 UT/500ML-% IV SOLN
2.5000 [IU]/h | INTRAVENOUS | Status: DC
  Administered 2021-04-23: 2.5 [IU]/h via INTRAVENOUS

## 2021-04-22 MED ORDER — PENICILLIN G POT IN DEXTROSE 60000 UNIT/ML IV SOLN
3.0000 10*6.[IU] | INTRAVENOUS | Status: DC
  Administered 2021-04-22 – 2021-04-23 (×2): 3 10*6.[IU] via INTRAVENOUS
  Filled 2021-04-22 (×2): qty 50

## 2021-04-22 MED ORDER — OXYTOCIN BOLUS FROM INFUSION
333.0000 mL | Freq: Once | INTRAVENOUS | Status: AC
  Administered 2021-04-23: 333 mL via INTRAVENOUS

## 2021-04-22 MED ORDER — DIPHENHYDRAMINE HCL 50 MG/ML IJ SOLN: 12.5000 mg | INTRAMUSCULAR | Status: DC | PRN

## 2021-04-22 MED ORDER — OXYCODONE-ACETAMINOPHEN 5-325 MG PO TABS
2.0000 | ORAL_TABLET | ORAL | Status: DC | PRN
Start: 2021-04-22 — End: 2021-04-23

## 2021-04-22 NOTE — Progress Notes (Signed)
Patient ID: Megan Gray, female   DOB: 01-Aug-1996, 25 y.o.   MRN: [id number] Pt feeling some contractions, still overall mild  Afeb VSS FHR caegory 1  Cervix 2/50/-2 AROM with FSE clear  Continue to titrate pitocin Epidural prn

## 2021-04-22 NOTE — H&P (Signed)
Catriona Nolton is a 25 y.o. female G2P1001 at 31 5/7 weeks (EDD 04/24/21 by LMP c/w 9 week [place]) presenting for IOL at term.  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  OB History     Gravida  2   Para  1   Term  1   Preterm      AB      Living  1      SAB      IAB      Ectopic      Multiple  0   Live Births  1         2016 NSVD 8#2oz female  Past Medical History:  Diagnosis Date   Headache    Past Surgical History:  Procedure Laterality Date   NO PAST SURGERIES     Family History: family history is not on file. Social History:  reports that she has never smoked. She has never used smokeless tobacco. She reports that she does not drink alcohol and does not use drugs.     Maternal Diabetes: No Genetic Screening: Declined Maternal Ultrasounds/Referrals: Normal Fetal Ultrasounds or other Referrals:  None Maternal Substance Abuse:  No Significant Maternal Medications:  Meds include: Other:  Valtrex Significant Maternal Lab Results:  Group B Strep positive Other Comments:  None  Review of Systems  Constitutional:  Negative for fever.  Gastrointestinal:  Negative for abdominal pain.  Genitourinary:  Negative for vaginal bleeding.  Maternal Medical History:  Contractions: Frequency: irregular.   Perceived severity is mild.   Fetal activity: Perceived fetal activity is normal.   Prenatal complications: HSV, +GBS Prenatal Complications - Diabetes: none.    unknown if currently breastfeeding. Maternal Exam:  Uterine Assessment: Contraction strength is mild.  Contraction frequency is irregular.  Abdomen: Patient reports no abdominal tenderness. Fetal presentation: vertex Introitus: Normal vulva. Normal vagina.   Physical Exam Constitutional:      Appearance: Normal appearance.  Cardiovascular:     Rate and Rhythm: Normal rate and regular  rhythm.  Pulmonary:     Effort: Pulmonary effort is normal.  Abdominal:     Palpations: Abdomen is soft.  Genitourinary:    General: Normal vulva.  Neurological:     Mental Status: She is alert.  Psychiatric:        Mood and Affect: Mood normal.  Cervix 1/40/-3 vtx  Prenatal labs: ABO, Rh: --/--/A POS (09/10 1642) Antibody: NEG (09/10 1642) Rubella: Immune (02/07 0000) RPR: Nonreactive (02/07 0000)  HBsAg: Negative (02/07 0000)  HIV: Non-reactive (02/07 0000)  GBS:   Positive Hgb AA One hour GCT 126   Assessment/Plan: Pt presents for elective IOL, will start pitocin PCN for +GBS Planning epidural  Oliver Pila 04/22/2021, 5:27 PM

## 2021-04-22 NOTE — Progress Notes (Signed)
Patient ID: Megan Gray, female   DOB: January 16, 1996, 25 y.o.   MRN: [id number] Pt getting uncomfortable and requesting epidural  Afeb VSS FHR category 1  70/4/-1  Will proceed with epidural Pitocin at 16 mu

## 2021-04-23 ENCOUNTER — Inpatient Hospital Stay (HOSPITAL_COMMUNITY): Payer: 59 | Admitting: Anesthesiology

## 2021-04-23 ENCOUNTER — Inpatient Hospital Stay (HOSPITAL_COMMUNITY): Payer: 59

## 2021-04-23 ENCOUNTER — Inpatient Hospital Stay (HOSPITAL_COMMUNITY): Admission: AD | Admit: 2021-04-23 | Payer: 59 | Source: Home / Self Care | Admitting: Obstetrics and Gynecology

## 2021-04-23 ENCOUNTER — Encounter (HOSPITAL_COMMUNITY): Payer: Self-pay | Admitting: Obstetrics and Gynecology

## 2021-04-23 LAB — RPR: RPR Ser Ql: NONREACTIVE

## 2021-04-23 LAB — CBC
HCT: 34.2 % — ABNORMAL LOW (ref 36.0–46.0)
Hemoglobin: 11.2 g/dL — ABNORMAL LOW (ref 12.0–15.0)
MCH: 26.7 pg (ref 26.0–34.0)
MCHC: 32.7 g/dL (ref 30.0–36.0)
MCV: 81.6 fL (ref 80.0–100.0)
Platelets: 227 10*3/uL (ref 150–400)
RBC: 4.19 MIL/uL (ref 3.87–5.11)
RDW: 14.1 % (ref 11.5–15.5)
WBC: 15.8 10*3/uL — ABNORMAL HIGH (ref 4.0–10.5)
nRBC: 0 % (ref 0.0–0.2)

## 2021-04-23 MED ORDER — LIDOCAINE HCL (PF) 1 % IJ SOLN
INTRAMUSCULAR | Status: DC | PRN
  Administered 2021-04-23: 8 mL via EPIDURAL

## 2021-04-23 MED ORDER — SENNOSIDES-DOCUSATE SODIUM 8.6-50 MG PO TABS
2.0000 | ORAL_TABLET | ORAL | Status: DC
  Administered 2021-04-23 – 2021-04-24 (×2): 2 via ORAL
  Filled 2021-04-23 (×2): qty 2

## 2021-04-23 MED ORDER — BENZOCAINE-MENTHOL 20-0.5 % EX AERO
1.0000 "application " | INHALATION_SPRAY | CUTANEOUS | Status: DC | PRN
  Administered 2021-04-23: 1 via TOPICAL
  Filled 2021-04-23: qty 56

## 2021-04-23 MED ORDER — WITCH HAZEL-GLYCERIN EX PADS
1.0000 "application " | MEDICATED_PAD | CUTANEOUS | Status: DC | PRN
  Administered 2021-04-23: 1 via TOPICAL

## 2021-04-23 MED ORDER — ACETAMINOPHEN 325 MG PO TABS
650.0000 mg | ORAL_TABLET | ORAL | Status: DC | PRN
  Administered 2021-04-23 – 2021-04-24 (×2): 650 mg via ORAL
  Filled 2021-04-23 (×2): qty 2

## 2021-04-23 MED ORDER — PRENATAL MULTIVITAMIN CH
1.0000 | ORAL_TABLET | Freq: Every day | ORAL | Status: DC
  Administered 2021-04-23 – 2021-04-24 (×2): 1 via ORAL
  Filled 2021-04-23 (×2): qty 1

## 2021-04-23 MED ORDER — ZOLPIDEM TARTRATE 5 MG PO TABS: 5.0000 mg | ORAL_TABLET | Freq: Every evening | ORAL | Status: DC | PRN

## 2021-04-23 MED ORDER — ONDANSETRON HCL 4 MG PO TABS: 4.0000 mg | ORAL_TABLET | ORAL | Status: DC | PRN

## 2021-04-23 MED ORDER — IBUPROFEN 600 MG PO TABS
600.0000 mg | ORAL_TABLET | Freq: Four times a day (QID) | ORAL | Status: DC
  Administered 2021-04-23 – 2021-04-24 (×6): 600 mg via ORAL
  Filled 2021-04-23 (×6): qty 1

## 2021-04-23 MED ORDER — DIPHENHYDRAMINE HCL 50 MG/ML IJ SOLN
12.5000 mg | INTRAMUSCULAR | Status: DC | PRN
Start: 2021-04-23 — End: 2021-04-23

## 2021-04-23 MED ORDER — DIBUCAINE (PERIANAL) 1 % EX OINT
1.0000 "application " | TOPICAL_OINTMENT | CUTANEOUS | Status: DC | PRN
  Administered 2021-04-23: 1 via RECTAL
  Filled 2021-04-23: qty 28

## 2021-04-23 MED ORDER — ONDANSETRON HCL 4 MG/2ML IJ SOLN: 4.0000 mg | INTRAMUSCULAR | Status: DC | PRN

## 2021-04-23 MED ORDER — TETANUS-DIPHTH-ACELL PERTUSSIS 5-2.5-18.5 LF-MCG/0.5 IM SUSY: 0.5000 mL | PREFILLED_SYRINGE | Freq: Once | INTRAMUSCULAR | Status: DC

## 2021-04-23 MED ORDER — COCONUT OIL OIL: 1.0000 "application " | TOPICAL_OIL | Status: DC | PRN

## 2021-04-23 MED ORDER — SIMETHICONE 80 MG PO CHEW: 80.0000 mg | CHEWABLE_TABLET | ORAL | Status: DC | PRN

## 2021-04-23 MED ORDER — FENTANYL-BUPIVACAINE-NACL 0.5-0.125-0.9 MG/250ML-% EP SOLN
12.0000 mL/h | EPIDURAL | Status: DC | PRN
Start: 2021-04-23 — End: 2021-04-23

## 2021-04-23 MED ORDER — DIPHENHYDRAMINE HCL 25 MG PO CAPS: 25.0000 mg | ORAL_CAPSULE | Freq: Four times a day (QID) | ORAL | Status: DC | PRN

## 2021-04-23 NOTE — Anesthesia Procedure Notes (Signed)
Epidural Patient location during procedure: OB Start time: 04/23/2021 12:42 AM End time: 04/23/2021 12:46 AM  Staffing Anesthesiologist: Bethena Midget, MD  Preanesthetic Checklist Completed: patient identified, IV checked, site marked, risks and benefits discussed, surgical consent, monitors and equipment checked, pre-op evaluation and timeout performed  Epidural Patient position: sitting Prep: DuraPrep and site prepped and draped Patient monitoring: continuous pulse ox and blood pressure Approach: midline Location: L3-L4 Injection technique: LOR air  Needle:  Needle type: Tuohy  Needle gauge: 17 G Needle length: 9 cm and 9 Needle insertion depth: 6 cm Catheter type: closed end flexible Catheter size: 19 Gauge Catheter at skin depth: 12 cm Test dose: negative  Assessment Events: blood not aspirated, injection not painful, no injection resistance, no paresthesia and negative IV test

## 2021-04-23 NOTE — Plan of Care (Signed)
Patient and FOB verbalized understanding of fall risk d/t epidural placement.

## 2021-04-23 NOTE — Progress Notes (Signed)
Patient ID: Megan Gray, female   DOB: [birth year], 25 y.o.   MRN: [id number] Pt sleeping on rounds so did not disturb her Per RN doing fine

## 2021-04-23 NOTE — Anesthesia Preprocedure Evaluation (Signed)
Anesthesia Evaluation  Patient identified by MRN, date of birth, ID band Patient awake    Reviewed: Allergy & Precautions, H&P , NPO status , Patient's Chart, lab work & pertinent test results, reviewed documented beta blocker date and time   Airway Mallampati: II  TM Distance: >3 FB Neck ROM: full    Dental no notable dental hx.    Pulmonary neg pulmonary ROS,    Pulmonary exam normal breath sounds clear to auscultation       Cardiovascular negative cardio ROS Normal cardiovascular exam Rhythm:regular Rate:Normal     Neuro/Psych negative neurological ROS  negative psych ROS   GI/Hepatic negative GI ROS, Neg liver ROS,   Endo/Other  negative endocrine ROS  Renal/GU negative Renal ROS  negative genitourinary   Musculoskeletal   Abdominal   Peds  Hematology negative hematology ROS (+)   Anesthesia Other Findings   Reproductive/Obstetrics (+) Pregnancy                             Anesthesia Physical Anesthesia Plan  ASA: 2  Anesthesia Plan: Epidural   Post-op Pain Management:    Induction:   PONV Risk Score and Plan:   Airway Management Planned: Natural Airway  Additional Equipment:   Intra-op Plan:   Post-operative Plan:   Informed Consent: I have reviewed the patients History and Physical, chart, labs and discussed the procedure including the risks, benefits and alternatives for the proposed anesthesia with the patient or authorized representative who has indicated his/her understanding and acceptance.     Dental Advisory Given  Plan Discussed with: Anesthesiologist  Anesthesia Plan Comments: (Labs checked- platelets confirmed with RN in room. Fetal heart tracing, per RN, reported to be stable enough for sitting procedure. Discussed epidural, and patient consents to the procedure:  included risk of possible headache,backache, failed block, allergic reaction, and nerve  injury. This patient was asked if she had any questions or concerns before the procedure started.)        Anesthesia Quick Evaluation

## 2021-04-23 NOTE — Progress Notes (Signed)
Patient ID: Megan Gray, female   DOB: November 27, 1995, 25 y.o.   MRN: [id number] Pt comfortable with epidural  Afeb vss FHR category 1  Cervix 90/8/0  Making good progress Expect vaginal delivery in next 1-2 hours

## 2021-04-23 NOTE — Anesthesia Postprocedure Evaluation (Signed)
Anesthesia Post Note  Patient: [name]  Procedure(s) Performed: AN AD HOC LABOR EPIDURAL     Patient location during evaluation: Mother Baby Anesthesia Type: Epidural Level of consciousness: awake and alert Pain management: pain level controlled Vital Signs Assessment: post-procedure vital signs reviewed and stable Respiratory status: spontaneous breathing, nonlabored ventilation and respiratory function stable Cardiovascular status: stable Postop Assessment: no headache, no backache and epidural receding Anesthetic complications: no   No notable events documented.  Last Vitals:  Vitals:   04/23/21 1125 04/23/21 1540  BP: 115/81 110/75  Pulse: 78 72  Resp: 20 20  Temp: 36.9 C 37 C  SpO2:      Last Pain:  Vitals:   04/23/21 1759  TempSrc:   PainSc: 4                  Megan Gray

## 2021-04-24 MED ORDER — IBUPROFEN 600 MG PO TABS: 600.0000 mg | ORAL_TABLET | Freq: Four times a day (QID) | ORAL | 0 refills | Status: AC

## 2021-04-24 MED ORDER — OXYCODONE HCL 5 MG PO TABS: 5.0000 mg | ORAL_TABLET | Freq: Four times a day (QID) | ORAL | 0 refills | Status: AC | PRN

## 2021-04-24 MED ORDER — OXYCODONE HCL 5 MG PO TABS
5.0000 mg | ORAL_TABLET | Freq: Four times a day (QID) | ORAL | Status: DC | PRN
  Administered 2021-04-24 (×2): 5 mg via ORAL
  Filled 2021-04-24 (×2): qty 1

## 2021-04-24 NOTE — Progress Notes (Signed)
Patient is doing well.  She is ambulating, voiding, tolerating PO.  Pain control is good.  Lochia is appropriate.  She had some increase in cramping overnight that responded well to oxycodone 5mg   Vitals:   04/23/21 1125 04/23/21 1540 04/23/21 1945 04/24/21 0530  BP: 115/81 110/75 131/84 114/76  Pulse: 78 72 80 68  Resp: 20 20 18 17   Temp: 98.5 F (36.9 C) 98.6 F (37 C) 98.6 F (37 C) 97.8 F (36.6 C)  TempSrc: Oral Oral Oral Oral  SpO2:   100% 100%  Weight:      Height:        NAD Fundus firm Ext: trace edema bilaterally  Lab Results  Component Value Date   WBC 15.8 (H) 04/23/2021   HGB 11.2 (L) 04/23/2021   HCT 34.2 (L) 04/23/2021   MCV 81.6 04/23/2021   PLT 227 04/23/2021    --/--/A POS (09/10 1642)/RImmune  A/P 24 y.o. 06/23/2021 PPD#1 s/p TSVD. Routine care.   Meeting all goals, desires discharge to home today   [facility] [employer]

## 2021-04-24 NOTE — Discharge Summary (Signed)
Postpartum Discharge Summary  Date of Service updated 04/24/21     Patient Name: Megan Gray DOB: 28-Aug-1995 MRN: [id number]  Date of admission: 04/22/2021 Delivery date:04/23/2021  Delivering provider: Huel Cote  Date of discharge: 04/24/2021  Admitting diagnosis: Encounter for induction of labor [Z34.90] NSVD (normal spontaneous vaginal delivery) [O80] Intrauterine pregnancy: [redacted]w[redacted]d     Secondary diagnosis:  Active Problems:   Encounter for induction of labor  Additional problems: None    Discharge diagnosis: Term Pregnancy Delivered                                              Post partum procedures: None Augmentation: AROM and Pitocin Complications: None  Hospital course: Induction of Labor With Vaginal Delivery   25 y.o. yo [id number] at [redacted]w[redacted]d was admitted to the hospital 04/22/2021 for induction of labor.  Indication for induction: Elective.  Patient had an uncomplicated labor course as follows: Membrane Rupture Time/Date: 8:40 PM ,04/22/2021   Delivery Method:Vaginal, Spontaneous  Episiotomy: None  Lacerations:  Periurethral  Details of delivery can be found in separate delivery note.  Patient had a routine postpartum course. Patient is discharged home 04/24/21.  Newborn Data: Birth date:04/23/2021  Birth time:3:54 AM  Gender:Female  Living status:Living  Apgars:8 ,9  Weight:4046 g     Physical exam  Vitals:   04/23/21 1125 04/23/21 1540 04/23/21 1945 04/24/21 0530  BP: 115/81 110/75 131/84 114/76  Pulse: 78 72 80 68  Resp: 20 20 18 17   Temp: 98.5 F (36.9 C) 98.6 F (37 C) 98.6 F (37 C) 97.8 F (36.6 C)  TempSrc: Oral Oral Oral Oral  SpO2:   100% 100%  Weight:      Height:       General: alert, cooperative, and no distress Lochia: appropriate Uterine Fundus: firm Incision: N/A DVT Evaluation: No evidence of DVT seen on physical exam. Labs: Lab Results  Component Value Date   WBC 15.8 (H) 04/23/2021   HGB 11.2 (L) 04/23/2021   HCT 34.2  (L) 04/23/2021   MCV 81.6 04/23/2021   PLT 227 04/23/2021   No flowsheet data found. Edinburgh Score: Edinburgh Postnatal Depression Scale Screening Tool 04/23/2021  I have been able to laugh and see the funny side of things. 0  I have looked forward with enjoyment to things. 0  I have blamed myself unnecessarily when things went wrong. 0  I have been anxious or worried for no good reason. 0  I have felt scared or panicky for no good reason. 0  Things have been getting on top of me. 0  I have been so unhappy that I have had difficulty sleeping. 0  I have felt sad or miserable. 0  I have been so unhappy that I have been crying. 0  The thought of harming myself has occurred to me. 0  Edinburgh Postnatal Depression Scale Total 0      After visit meds:  Allergies as of 04/24/2021   No Known Allergies      Medication List     STOP taking these medications    OVER THE COUNTER MEDICATION   oxyCODONE-acetaminophen 5-325 MG tablet Commonly known as: PERCOCET/ROXICET       TAKE these medications    ibuprofen 600 MG tablet Commonly known as: ADVIL Take 1 tablet (600 mg total) by mouth every 6 (six)  hours. What changed:  medication strength how much to take when to take this reasons to take this   oxyCODONE 5 MG immediate release tablet Commonly known as: Oxy IR/ROXICODONE Take 1 tablet (5 mg total) by mouth every 6 (six) hours as needed for severe pain.   prenatal multivitamin Tabs tablet Take 1 tablet by mouth daily at 12 noon.   ranitidine 75 MG tablet Commonly known as: ZANTAC Take 75 mg by mouth daily.         Discharge home in stable condition Infant Feeding: Breast Infant Disposition:home with mother Discharge instruction: per After Visit Summary and Postpartum booklet. Activity: Advance as tolerated. Pelvic rest for 6 weeks.  Diet: routine diet Anticipated Birth Control: Unsure Postpartum Appointment:6 weeks Additional Postpartum F/U:  n/a Future  Appointments:No future appointments. Follow up Visit:  Follow-up Information     Huel Cote, MD Follow up in 6 week(s).   Specialty: Obstetrics and Gynecology Contact information: [address] [place] [address] [phone]                     04/24/2021 [facility] GEFFEL Chestine Spore, MD

## 2021-04-24 NOTE — Anesthesia Postprocedure Evaluation (Signed)
Anesthesia Post Note  Patient: [name]  Procedure(s) Performed: AN AD HOC LABOR EPIDURAL     Patient location during evaluation: Women's Unit Anesthesia Type: Epidural Level of consciousness: awake and alert Pain management: pain level controlled Vital Signs Assessment: post-procedure vital signs reviewed and stable Respiratory status: spontaneous breathing, nonlabored ventilation and respiratory function stable Cardiovascular status: stable Postop Assessment: no headache, no backache and epidural receding Anesthetic complications: no   No notable events documented.  Last Vitals:  Vitals:   04/23/21 1945 04/24/21 0530  BP: 131/84 114/76  Pulse: 80 68  Resp: 18 17  Temp: 37 C 36.6 C  SpO2: 100% 100%    Last Pain:  Vitals:   04/24/21 0720  TempSrc:   PainSc: 5    Pain Goal:                   [employer]

## 2021-04-24 NOTE — Addendum Note (Signed)
Addendum  created 04/24/21 5379 by Orlie Pollen, CRNA   Clinical Note Signed

## 2021-05-08 ENCOUNTER — Telehealth (HOSPITAL_COMMUNITY): Payer: Self-pay | Admitting: *Deleted

## 2021-05-08 NOTE — Telephone Encounter (Signed)
Attempted Hospital Discharge Follow-Up Call.  Left voice mail requesting that patient return RN's phone call.  

## 2021-11-17 ENCOUNTER — Encounter (HOSPITAL_BASED_OUTPATIENT_CLINIC_OR_DEPARTMENT_OTHER): Payer: Self-pay

## 2021-11-17 ENCOUNTER — Emergency Department (HOSPITAL_BASED_OUTPATIENT_CLINIC_OR_DEPARTMENT_OTHER)
Admission: EM | Admit: 2021-11-17 | Discharge: 2021-11-17 | Disposition: A | Discharge status: Home / Self Care | Payer: BC Managed Care – PPO | Attending: Emergency Medicine | Admitting: Emergency Medicine

## 2021-11-17 ENCOUNTER — Other Ambulatory Visit: Payer: Self-pay

## 2021-11-17 DIAGNOSIS — R55 Syncope and collapse: Secondary | ICD-10-CM | POA: Insufficient documentation

## 2021-11-17 DIAGNOSIS — R000 Tachycardia, unspecified: Secondary | ICD-10-CM | POA: Insufficient documentation

## 2021-11-17 DIAGNOSIS — R42 Dizziness and giddiness: Secondary | ICD-10-CM | POA: Insufficient documentation

## 2021-11-17 DIAGNOSIS — J101 Influenza due to other identified influenza virus with other respiratory manifestations: Secondary | ICD-10-CM | POA: Insufficient documentation

## 2021-11-17 DIAGNOSIS — R519 Headache, unspecified: Secondary | ICD-10-CM | POA: Diagnosis present

## 2021-11-17 DIAGNOSIS — Z20822 Contact with and (suspected) exposure to covid-19: Secondary | ICD-10-CM | POA: Insufficient documentation

## 2021-11-17 LAB — RESP PANEL BY RT-PCR (FLU A&B, COVID) ARPGX2
Influenza A by PCR: NEGATIVE
Influenza B by PCR: POSITIVE — AB
SARS Coronavirus 2 by RT PCR: NEGATIVE

## 2021-11-17 LAB — BASIC METABOLIC PANEL
Anion gap: 10 (ref 5–15)
BUN: 8 mg/dL (ref 6–20)
CO2: 25 mmol/L (ref 22–32)
Calcium: 9.3 mg/dL (ref 8.9–10.3)
Chloride: 99 mmol/L (ref 98–111)
Creatinine, Ser: 0.85 mg/dL (ref 0.44–1.00)
GFR, Estimated: >60 mL/min (ref >60)
Glucose, Bld: 128 mg/dL — ABNORMAL HIGH (ref 70–99)
Potassium: 3.4 mmol/L — ABNORMAL LOW (ref 3.5–5.1)
Sodium: 134 mmol/L — ABNORMAL LOW (ref 135–145)

## 2021-11-17 LAB — CBC
HCT: 43.2 % (ref 36.0–46.0)
Hemoglobin: 14.4 g/dL (ref 12.0–15.0)
MCH: 27.8 pg (ref 26.0–34.0)
MCHC: 33.3 g/dL (ref 30.0–36.0)
MCV: 83.4 fL (ref 80.0–100.0)
Platelets: 247 10*3/uL (ref 150–400)
RBC: 5.18 MIL/uL — ABNORMAL HIGH (ref 3.87–5.11)
RDW: 13.2 % (ref 11.5–15.5)
WBC: 4.4 10*3/uL (ref 4.0–10.5)
nRBC: 0 % (ref 0.0–0.2)

## 2021-11-17 LAB — URINALYSIS, ROUTINE W REFLEX MICROSCOPIC
Bilirubin Urine: NEGATIVE
Glucose, UA: NEGATIVE mg/dL
Hgb urine dipstick: NEGATIVE
Ketones, ur: NEGATIVE mg/dL
Leukocytes,Ua: NEGATIVE
Nitrite: NEGATIVE
Protein, ur: NEGATIVE mg/dL
Specific Gravity, Urine: 1.006 (ref 1.005–1.030)
pH: 6 (ref 5.0–8.0)

## 2021-11-17 LAB — PREGNANCY, URINE: Preg Test, Ur: NEGATIVE

## 2021-11-17 LAB — GROUP A STREP BY PCR: Group A Strep by PCR: NOT DETECTED

## 2021-11-17 MED ORDER — SODIUM CHLORIDE 0.9 % IV BOLUS
1000.0000 mL | Freq: Once | INTRAVENOUS | Status: AC
  Administered 2021-11-17: 1000 mL via INTRAVENOUS

## 2021-11-17 MED ORDER — ACETAMINOPHEN 325 MG PO TABS
650.0000 mg | ORAL_TABLET | Freq: Once | ORAL | Status: AC | PRN
  Administered 2021-11-17: 650 mg via ORAL
  Filled 2021-11-17: qty 2

## 2021-11-17 NOTE — ED Triage Notes (Signed)
Syncopal episode x today- +LOC states hit head - has been complaining of flu like symptoms x 1 day with a HA x Tuesday.  ?

## 2021-11-17 NOTE — ED Notes (Signed)
EMT-P provided AVS using Teachback Method. Patient verbalizes understanding of Discharge Instructions. Opportunity for Questioning and Answers were provided by EMT-P. Patient Discharged from ED.  ? ?

## 2021-11-17 NOTE — Discharge Instructions (Signed)
Home to rest and hydrate.  Continue Motrin and Tylenol as directed. ?Recheck with your doctor next week.  Return to the emergency room for any worsening or concerning symptoms. ?

## 2021-11-17 NOTE — ED Provider Notes (Signed)
?[facility] EMERGENCY DEPT ?Provider Note ? ? ?CSN: [id number] ?Arrival date & time: 11/17/21  1118 ? ?  ? ?History ? ?Chief Complaint  ?Patient presents with  ? Near Syncope  ? ? ?Megan Gray is a 26 y.o. female. ? ?26 yo female with syncopal episode, states she got up to make a bottle this AM, started to feel dizzy and passed out, fell backwards onto the floor. Thought to have brief LOC. Laid on the ground for a moment before getting back up, continued to feel dizzy and weak.  ?Headache since Tuesday, sore throat, congestion and cough onset yesterday, body aches, chills and sweats. Denies nausea, vomiting, diarrhea. Fever started today. ?Exposed to son who has a cough. No recent travel, no rash.  ?Given Tylenol for fever in the ER.  ?Otherwise healthy.  ? ? ?  ? ?Home Medications ?Prior to Admission medications   ?Medication Sig Start Date End Date Taking? Authorizing Provider  ?ibuprofen (ADVIL) 600 MG tablet Take 1 tablet (600 mg total) by mouth every 6 (six) hours. 04/24/21   Jerelyn Charles, MD  ?oxyCODONE (OXY IR/ROXICODONE) 5 MG immediate release tablet Take 1 tablet (5 mg total) by mouth every 6 (six) hours as needed for severe pain. 04/24/21   Jerelyn Charles, MD  ?Prenatal Vit-Fe Fumarate-FA (PRENATAL MULTIVITAMIN) TABS tablet Take 1 tablet by mouth daily at 12 noon. 04/10/15   Bovard-Stuckert, Jeral Fruit, MD  ?ranitidine (ZANTAC) 75 MG tablet Take 75 mg by mouth daily.    [provider]  ?   ? ?Allergies    ?Patient has no known allergies.   ? ?Review of Systems   ?Review of Systems ?Negative except as per HPI ?Physical Exam ?Updated Vital Signs ?BP 119/83   Pulse 78   Temp 99.7 ?F (37.6 ?C) (Oral)   Resp (!) 23   Ht 5\' 9"  (1.753 m)   Wt 92.6 kg   LMP 10/27/2021 (Exact Date)   SpO2 98%   BMI 30.15 kg/m?  ?Physical Exam ?Vitals and nursing note reviewed.  ?Constitutional:   ?   General: She is not in acute distress. ?   Appearance: She is well-developed. She is not diaphoretic.  ?HENT:   ?   Head: Normocephalic and atraumatic.  ?   Right Ear: Tympanic membrane and ear canal normal.  ?   Left Ear: Tympanic membrane and ear canal normal.  ?   Nose: Nose normal.  ?   Mouth/Throat:  ?   Mouth: Mucous membranes are moist.  ?   Pharynx: Posterior oropharyngeal erythema present. No oropharyngeal exudate.  ?Eyes:  ?   Extraocular Movements: Extraocular movements intact.  ?   Pupils: Pupils are equal, round, and reactive to light.  ?Cardiovascular:  ?   Rate and Rhythm: Normal rate and regular rhythm.  ?   Pulses: Normal pulses.  ?   Heart sounds: Normal heart sounds.  ?Pulmonary:  ?   Effort: Pulmonary effort is normal.  ?   Breath sounds: Normal breath sounds.  ?Abdominal:  ?   Palpations: Abdomen is soft.  ?   Tenderness: There is no abdominal tenderness.  ?Musculoskeletal:  ?   Cervical back: Neck supple.  ?   Right lower leg: No edema.  ?   Left lower leg: No edema.  ?Skin: ?   General: Skin is warm and dry.  ?   Findings: No erythema or rash.  ?Neurological:  ?   Mental Status: She is alert and oriented to person, place,  and time.  ?   Motor: No weakness.  ?Psychiatric:     ?   Behavior: Behavior normal.  ? ? ?ED Results / Procedures / Treatments   ?Labs ?(all labs ordered are listed, but only abnormal results are displayed) ?Labs Reviewed  ?RESP PANEL BY RT-PCR (FLU A&B, COVID) ARPGX2 - Abnormal; Notable for the following components:  ?    Result Value  ? Influenza B by PCR POSITIVE (*)   ? All other components within normal limits  ?BASIC METABOLIC PANEL - Abnormal; Notable for the following components:  ? Sodium 134 (*)   ? Potassium 3.4 (*)   ? Glucose, Bld 128 (*)   ? All other components within normal limits  ?CBC - Abnormal; Notable for the following components:  ? RBC 5.18 (*)   ? All other components within normal limits  ?URINALYSIS, ROUTINE W REFLEX MICROSCOPIC - Abnormal; Notable for the following components:  ? Color, Urine COLORLESS (*)   ? All other components within normal limits   ?GROUP A STREP BY PCR  ?PREGNANCY, URINE  ? ? ?EKG ?EKG Interpretation ? ?Date/Time:  Friday November 17 2021 11:55:23 EDT ?Ventricular Rate:  121 ?PR Interval:  132 ?QRS Duration: 92 ?QT Interval:  304 ?QTC Calculation: 431 ?R Axis:   114 ?Text Interpretation: Sinus tachycardia Right axis deviation Abnormal ECG No previous ECGs available Confirmed by Malvin Johns [id number]) on 11/17/2021 3:32:14 PM ? ?Radiology ?No results found. ? ?Procedures ?Procedures  ? ? ?Medications Ordered in ED ?Medications  ?acetaminophen (TYLENOL) tablet 650 mg (650 mg Oral Given 11/17/21 1322)  ?sodium chloride 0.9 % bolus 1,000 mL (0 mLs Intravenous Stopped 11/17/21 1506)  ? ? ?ED Course/ Medical Decision Making/ A&P ?  ?                        ?Medical Decision Making ?Amount and/or Complexity of Data Reviewed ?Labs: ordered. ? ?Risk ?OTC drugs. ? ? ?This patient presents to the ED for concern of syncopal episode today, feeling dizzy while fixing a bottle for her baby then fell backwards and hit the back of her head with brief LOC.  Reports flulike symptoms for the past few days, this involves an extensive number of treatment options, and is a complaint that carries with it a high risk of complications and morbidity.  The differential diagnosis includes but not limited to COVID, flu, gastroenteritis, arrhythmia ? ? ?Co morbidities that complicate the patient evaluation ? ?Otherwise healthy ? ? ?Additional history obtained: ? ?Additional history obtained from family member at bedside ?External records from outside source obtained and reviewed including no recent relevant records ? ? ?Lab Tests: ? ?I Ordered, and personally interpreted labs.  The pertinent results include: CBC with normal [place] blood cell count, BMP without significant electrolyte derangement.  Urinalysis is unremarkable, pregnancy test negative, rapid strep negative.  Patient is positive for influenza B, negative for influenza A and COVID. ? ? ?Cardiac Monitoring: / EKG: ? ?The  patient was maintained on a cardiac monitor.  I personally viewed and interpreted the cardiac monitored which showed an underlying rhythm of: sinus tachycardia initially  ? ?Problem List / ED Course / Critical interventions / Medication management ? ?26 year old female with flulike symptoms with dizziness and then syncopal event today as above.  On exam, is well-appearing, nontoxic in no distress.  Patient is febrile on arrival, given Tylenol with improvement in temperature and heart rate.  IV fluid bolus given as  well, symptoms improved.  Found to be flu be positive, feels this likely explains patient's symptoms leading up to her syncopal episode secondary to fever and volume depletion.  Advised to rest and hydrate at home, continue Motrin and Tylenol.  Outside of much benefit from Tamiflu, Tamiflu concerning for possibly exacerbating her symptoms.  Recommend recheck with PCP with return to ER precautions. ?I ordered medication including IV fluids for volume depleted ?Reevaluation of the patient after these medicines showed that the patient improved ?I have reviewed the patients home medicines and have made adjustments as needed ? ? ? ? ? ? ? ? ? ?Final Clinical Impression(s) / ED Diagnoses ?Final diagnoses:  ?Influenza B  ?Syncope, unspecified syncope type  ? ? ?Rx / DC Orders ?ED Discharge Orders   ? ? None  ? ?  ? ? ?  ?Tacy Learn, PA-C ?11/17/21 1736 ? ?  ?Malvin Johns, MD ?11/29/21 1605 ? ?

## 2021-12-22 ENCOUNTER — Other Ambulatory Visit: Payer: Self-pay

## 2021-12-22 DIAGNOSIS — R229 Localized swelling, mass and lump, unspecified: Secondary | ICD-10-CM

## 2022-01-22 ENCOUNTER — Ambulatory Visit
Admission: RE | Admit: 2022-01-22 | Discharge: 2022-01-22 | Disposition: A | Discharge status: Home / Self Care | Payer: Medicaid Other | Source: Ambulatory Visit

## 2022-01-22 DIAGNOSIS — R229 Localized swelling, mass and lump, unspecified: Secondary | ICD-10-CM

## 2023-12-11 IMAGING — US US AXILLARY LEFT
1 series · 6 of 6 positions shown · non-contrast
Comparison: None.

CLINICAL DATA: 25-year-old with an enlarging superficial mass in
the LEFT axilla initially identified approximately 1-1/2 months ago.
The patient started on doxycycline but could not complete the
prescription due to nausea.

EXAM:
ULTRASOUND OF THE LEFT AXILLA

[Series 1: us axillary left · 0.06mm/px · 6 of 6 slices shown]
[im 1/6]
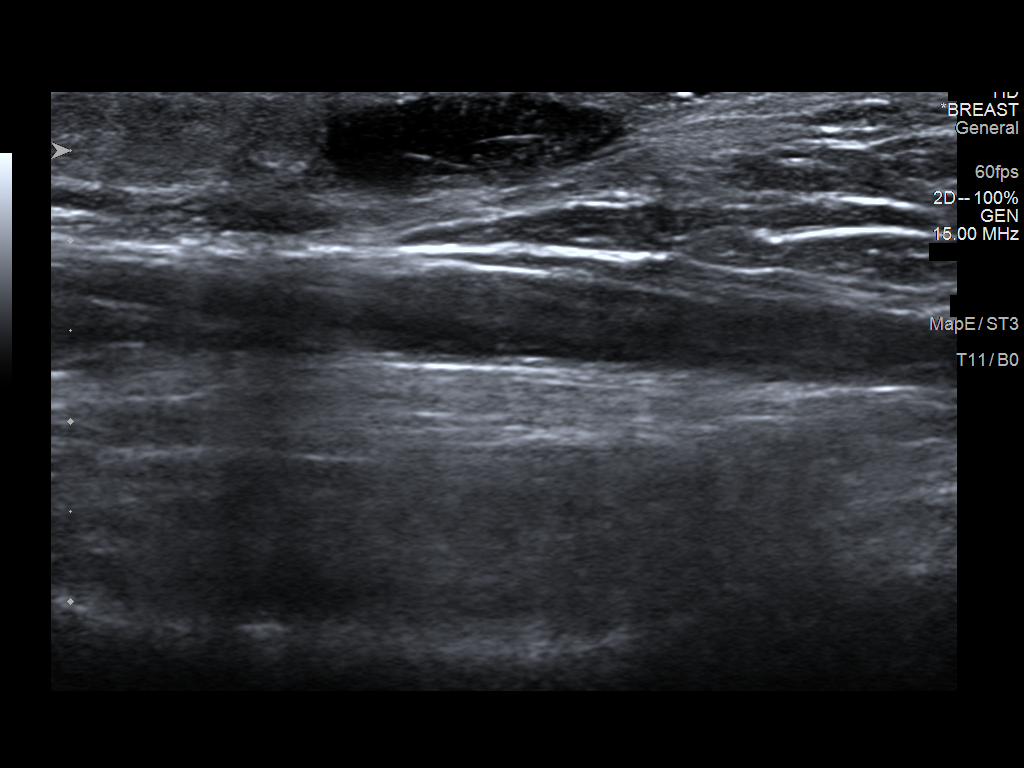
[im 2/6]
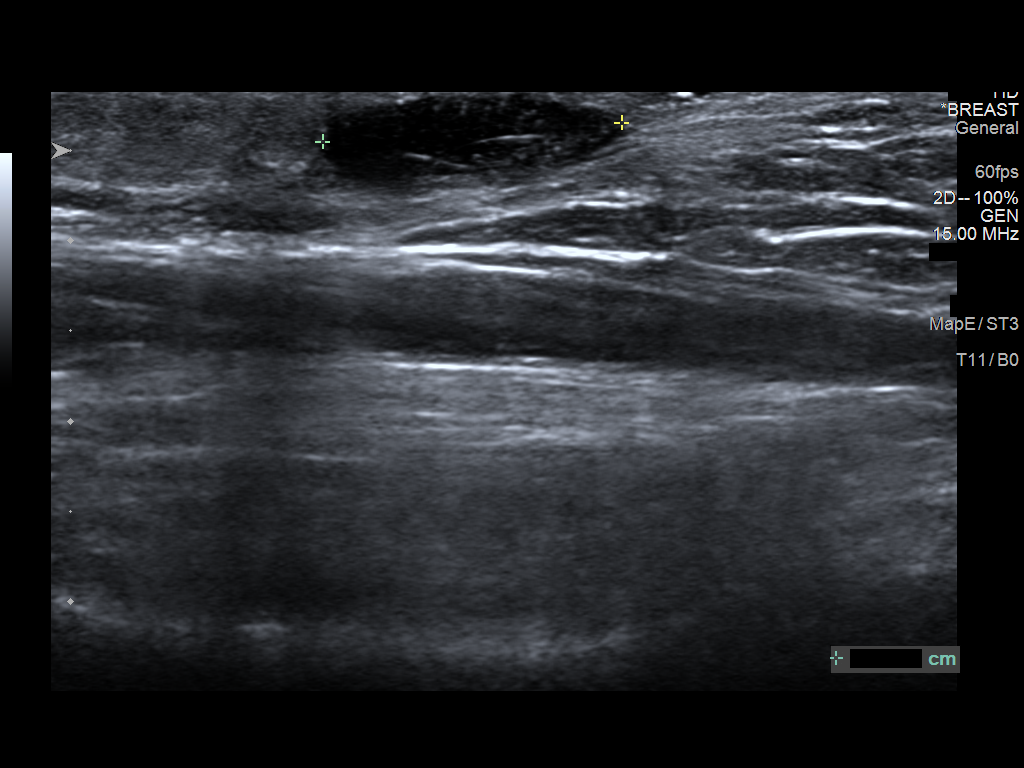
[im 3/6]
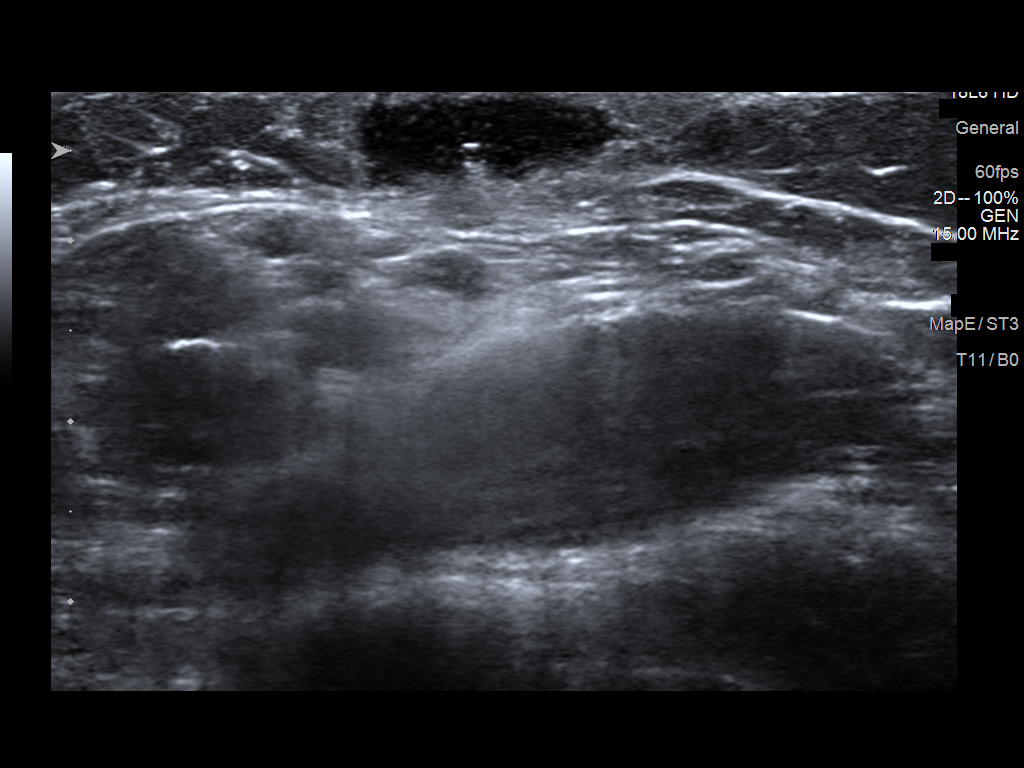
[im 4/6]
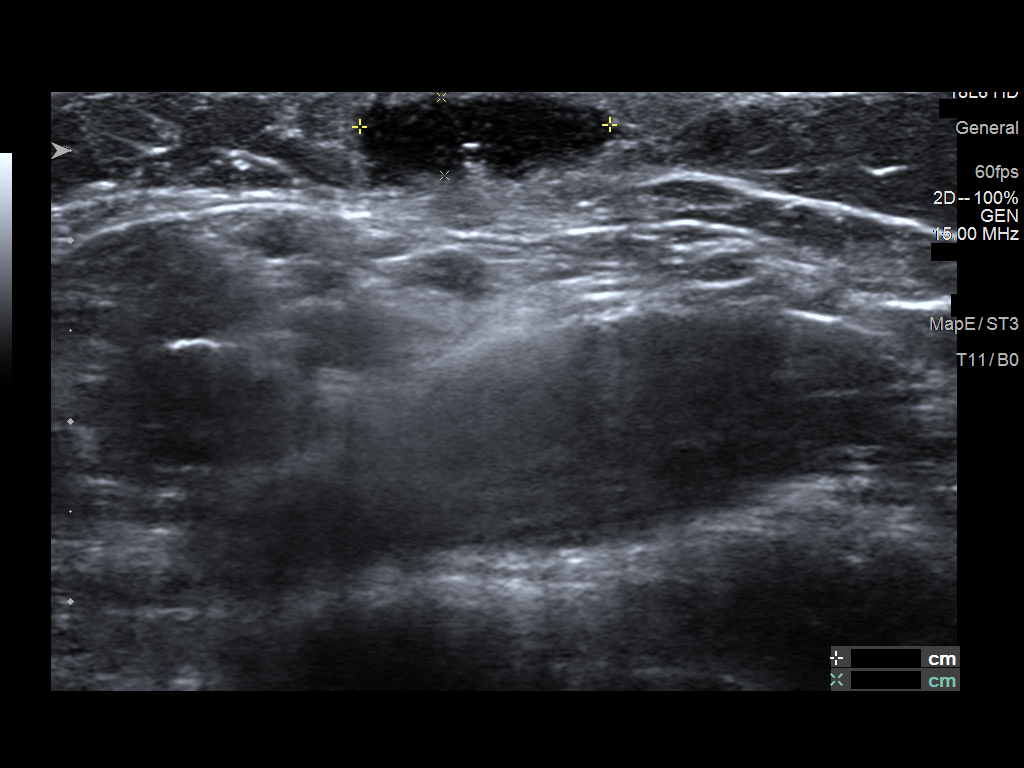
[im 5/6]
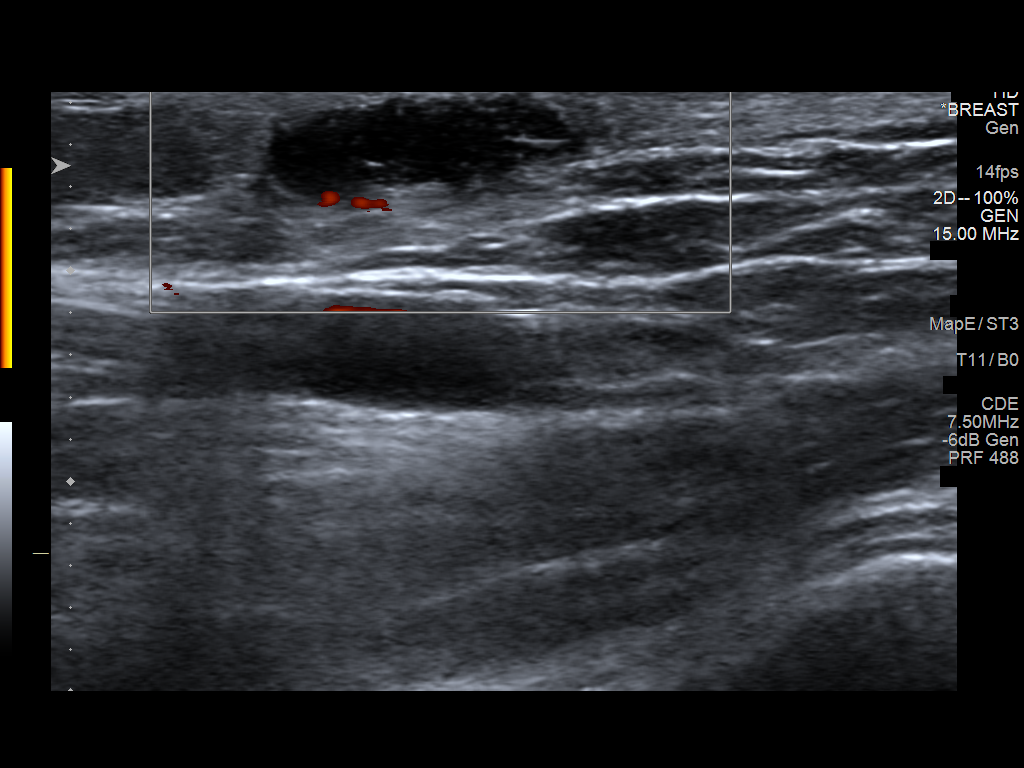
[im 6/6]
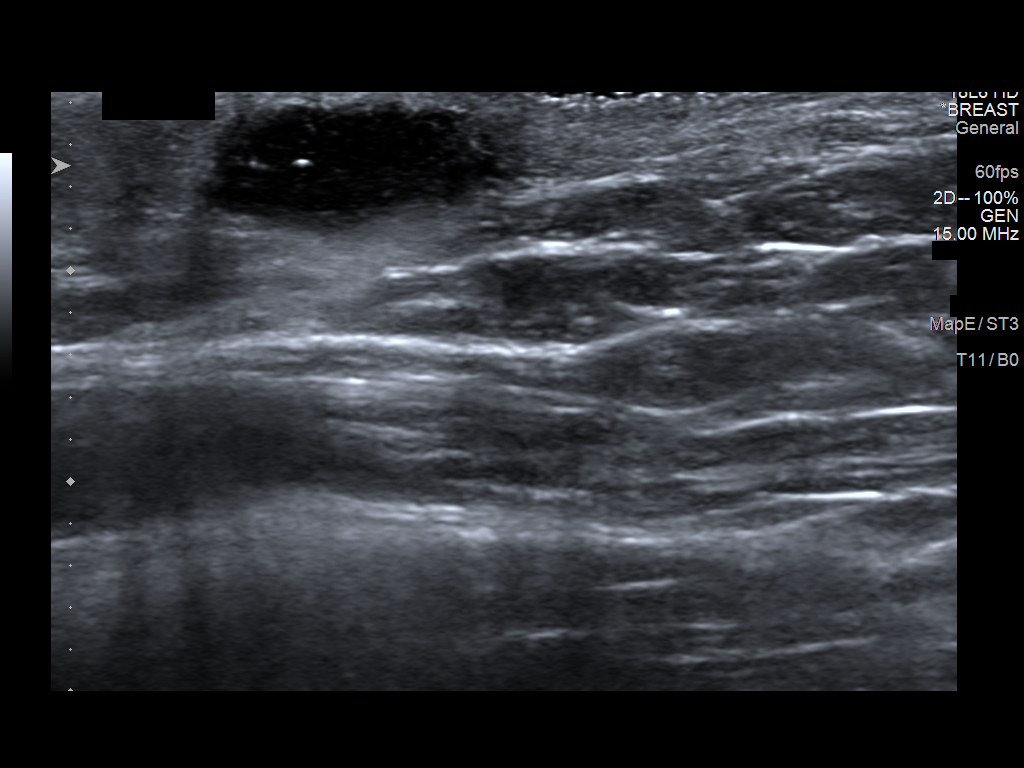

[6 of 6 positions shown; findings below may reference images not displayed]

FINDINGS: On correlative physical exam, there is a visible and palpable
superficial mass in the LEFT axilla. The patient denies tenderness
to palpation currently.

Ultrasound is performed, showing an anechoic mass with internal
echoes in the subcutaneous tissues of the LEFT axilla measuring
approximately 1.7 x 0.4 x 1.4 cm, demonstrating posterior acoustic
enhancement and no internal power Doppler flow, corresponding to the
palpable concern. There is a visible tract from the mass to the
skin.
IMPRESSION: Benign 1.7 cm sebaceous cyst in the subcutaneous tissues of the LEFT
axilla.

RECOMMENDATION:
No further imaging follow-up is necessary unless there are
persistent or subsequent clinical concerns.

I have discussed the findings and recommendations with the patient.
If applicable, a reminder letter will be sent to the patient
regarding the next appointment.

BI-RADS CATEGORY  2: Benign.

## 2025-01-18 ENCOUNTER — Ambulatory Visit: Admitting: Physician Assistant
